# Patient Record
Sex: Female | Born: 1980 | Race: White | Hispanic: No | Marital: Married | Smoking: Never smoker
Health system: Southern US, Community
[De-identification: ages and names within clinical notes are randomized; demographics above are authoritative.]

## PROBLEM LIST (undated history)

## (undated) DIAGNOSIS — Z789 Other specified health status: Secondary | ICD-10-CM

## (undated) HISTORY — PX: CHOLECYSTECTOMY: SHX55

## (undated) HISTORY — PX: OTHER SURGICAL HISTORY: SHX169

## (undated) HISTORY — DX: Other specified health status: Z78.9

---

## 2007-09-13 ENCOUNTER — Emergency Department (HOSPITAL_COMMUNITY): Admission: EM | Admit: 2007-09-13 | Discharge: 2007-09-13 | Payer: Self-pay | Admitting: Family Medicine

## 2007-10-30 ENCOUNTER — Inpatient Hospital Stay (HOSPITAL_COMMUNITY): Admission: RE | Admit: 2007-10-30 | Discharge: 2007-11-01 | Payer: Self-pay | Admitting: Obstetrics & Gynecology

## 2007-11-08 ENCOUNTER — Ambulatory Visit: Admission: RE | Admit: 2007-11-08 | Discharge: 2007-11-08 | Payer: Self-pay | Admitting: Obstetrics and Gynecology

## 2009-11-06 ENCOUNTER — Inpatient Hospital Stay (HOSPITAL_COMMUNITY): Admission: AD | Admit: 2009-11-06 | Discharge: 2009-11-08 | Payer: Self-pay | Admitting: Obstetrics and Gynecology

## 2009-11-25 ENCOUNTER — Encounter: Admission: RE | Admit: 2009-11-25 | Discharge: 2009-11-25 | Payer: Self-pay | Admitting: Internal Medicine

## 2009-12-14 ENCOUNTER — Observation Stay (HOSPITAL_COMMUNITY): Admission: EM | Admit: 2009-12-14 | Discharge: 2009-12-16 | Payer: Self-pay | Admitting: Emergency Medicine

## 2009-12-15 ENCOUNTER — Encounter (INDEPENDENT_AMBULATORY_CARE_PROVIDER_SITE_OTHER): Payer: Self-pay

## 2009-12-15 ENCOUNTER — Encounter: Payer: Self-pay | Admitting: General Surgery

## 2010-10-05 LAB — CBC
HCT: 35.7 % — ABNORMAL LOW (ref 36.0–46.0)
HCT: 38 % (ref 36.0–46.0)
Hemoglobin: 13 g/dL (ref 12.0–15.0)
MCHC: 34.3 g/dL (ref 30.0–36.0)
MCV: 90.8 fL (ref 78.0–100.0)
MCV: 91.4 fL (ref 78.0–100.0)
Platelets: 160 10*3/uL (ref 150–400)
Platelets: 175 10*3/uL (ref 150–400)
RBC: 3.93 MIL/uL (ref 3.87–5.11)
RBC: 4.15 MIL/uL (ref 3.87–5.11)
RDW: 12.3 % (ref 11.5–15.5)

## 2010-10-05 LAB — COMPREHENSIVE METABOLIC PANEL
AST: 248 U/L — ABNORMAL HIGH (ref 0–37)
BUN: 6 mg/dL (ref 6–23)
BUN: 9 mg/dL (ref 6–23)
Calcium: 9.4 mg/dL (ref 8.4–10.5)
Chloride: 105 mEq/L (ref 96–112)
Chloride: 108 mEq/L (ref 96–112)
Creatinine, Ser: 0.66 mg/dL (ref 0.4–1.2)
GFR calc Af Amer: >60 mL/min (ref >60)
GFR calc non Af Amer: >60 mL/min (ref >60)
Potassium: 3.6 mEq/L (ref 3.5–5.1)
Sodium: 139 mEq/L (ref 135–145)
Sodium: 140 mEq/L (ref 135–145)
Total Bilirubin: 4.6 mg/dL — ABNORMAL HIGH (ref 0.3–1.2)
Total Protein: 6.6 g/dL (ref 6.0–8.3)
Total Protein: 7.2 g/dL (ref 6.0–8.3)

## 2010-10-05 LAB — URINE MICROSCOPIC-ADD ON

## 2010-10-05 LAB — PREGNANCY, URINE: Preg Test, Ur: NEGATIVE

## 2010-10-05 LAB — LIPASE, BLOOD: Lipase: 42 U/L (ref 11–59)

## 2010-10-05 LAB — PROTIME-INR
INR: 1 (ref 0.00–1.49)
Prothrombin Time: 13.1 seconds (ref 11.6–15.2)

## 2010-10-05 LAB — URINALYSIS, ROUTINE W REFLEX MICROSCOPIC
Glucose, UA: NEGATIVE mg/dL
Ketones, ur: NEGATIVE mg/dL
Specific Gravity, Urine: 1.016 (ref 1.005–1.030)
Urobilinogen, UA: 0.2 mg/dL (ref 0.0–1.0)

## 2010-10-05 LAB — DIFFERENTIAL
Basophils Absolute: 0 10*3/uL (ref 0.0–0.1)
Basophils Absolute: 0 10*3/uL (ref 0.0–0.1)
Basophils Relative: 1 % (ref 0–1)
Eosinophils Absolute: 0.1 10*3/uL (ref 0.0–0.7)
Eosinophils Relative: 1 % (ref 0–5)
Lymphs Abs: 1.5 10*3/uL (ref 0.7–4.0)
Monocytes Absolute: 0.4 10*3/uL (ref 0.1–1.0)
Neutro Abs: 4.2 10*3/uL (ref 1.7–7.7)

## 2010-10-05 LAB — URINE CULTURE

## 2010-10-05 LAB — APTT: aPTT: 26 seconds (ref 24–37)

## 2010-10-06 LAB — RPR: RPR Ser Ql: NONREACTIVE

## 2010-10-06 LAB — CBC
HCT: 35.3 % — ABNORMAL LOW (ref 36.0–46.0)
HCT: 39.5 % (ref 36.0–46.0)
Hemoglobin: 12.1 g/dL (ref 12.0–15.0)
Hemoglobin: 13.5 g/dL (ref 12.0–15.0)
Platelets: 209 10*3/uL (ref 150–400)
RBC: 3.74 MIL/uL — ABNORMAL LOW (ref 3.87–5.11)
RBC: 4.25 MIL/uL (ref 3.87–5.11)
RDW: 14.6 % (ref 11.5–15.5)
WBC: 13.4 10*3/uL — ABNORMAL HIGH (ref 4.0–10.5)
WBC: 9 10*3/uL (ref 4.0–10.5)

## 2011-04-09 LAB — DIFFERENTIAL
Basophils Absolute: 0
Basophils Relative: 0
Lymphocytes Relative: 6 — ABNORMAL LOW
Neutro Abs: 11 — ABNORMAL HIGH
Neutrophils Relative %: 88 — ABNORMAL HIGH

## 2011-04-09 LAB — CBC
Hemoglobin: 12.8
MCHC: 34.3
RBC: 4.17
RDW: 13
WBC: 12.4 — ABNORMAL HIGH

## 2011-04-13 LAB — COMPREHENSIVE METABOLIC PANEL
ALT: 18
Alkaline Phosphatase: 104
CO2: 22
Chloride: 103
Glucose, Bld: 85
Potassium: 4.4
Sodium: 136
Total Bilirubin: 0.4
Total Protein: 6.9

## 2011-04-13 LAB — CBC
HCT: 37.8
Hemoglobin: 13.4
MCHC: 35.2
MCV: 89.8
RBC: 3.47 — ABNORMAL LOW
RBC: 4.24
RDW: 14.6
WBC: 11.9 — ABNORMAL HIGH

## 2011-04-13 LAB — LACTATE DEHYDROGENASE: LDH: 118

## 2011-04-13 LAB — CCBB MATERNAL DONOR DRAW

## 2011-04-19 IMAGING — US US ABDOMEN COMPLETE
1 series · 14 of 25 positions shown · non-contrast
Comparison: None.

CLINICAL DATA: Episodic right upper quadrant pain, 2 weeks
postpartum, nausea

COMPLETE ABDOMINAL ULTRASOUND

[Series 1: us abdomen complete · 0.24mm/px · 14 of 64 slices shown]
[im 1/64]
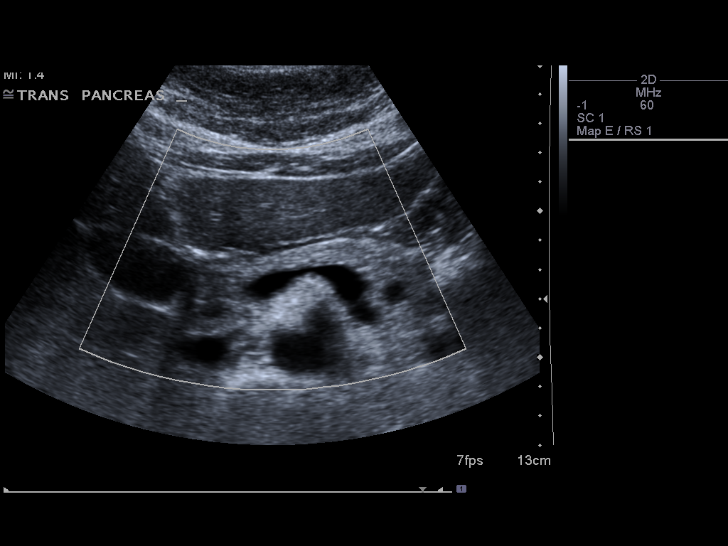
[im 6/64]
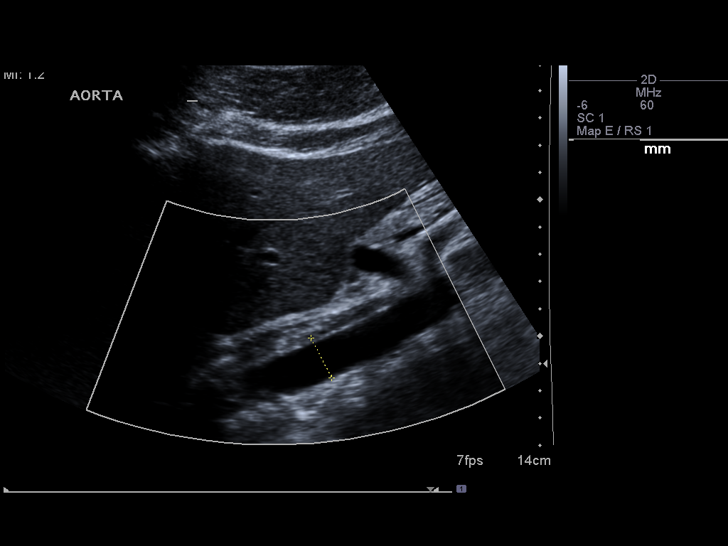
[im 11/64]
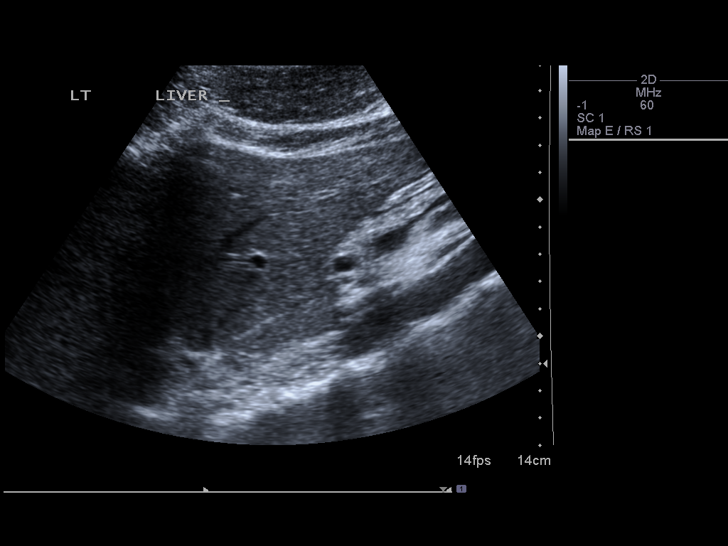
[im 16/64]
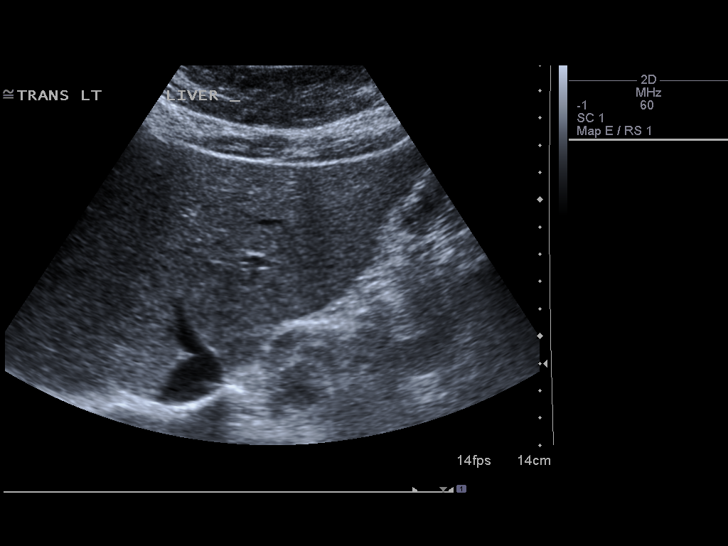
[im 22/64]
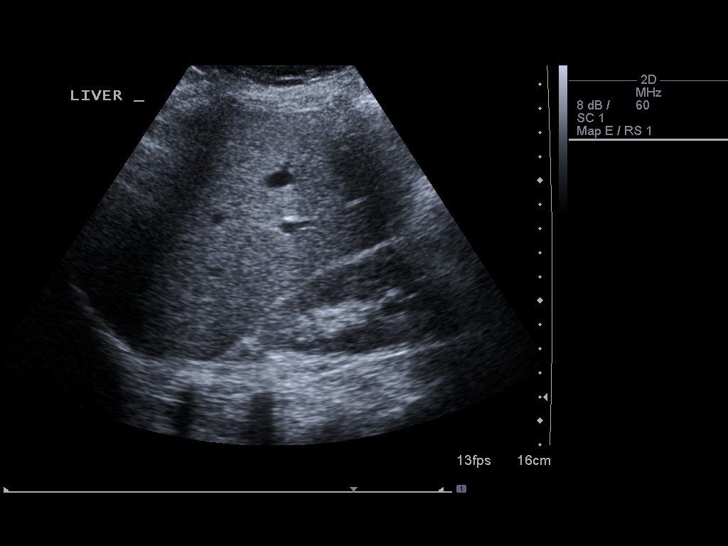
[im 24/64]
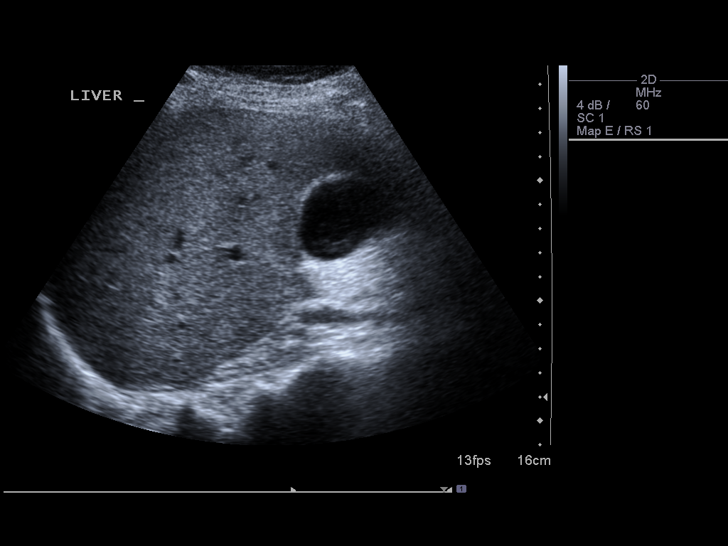
[im 29/64]
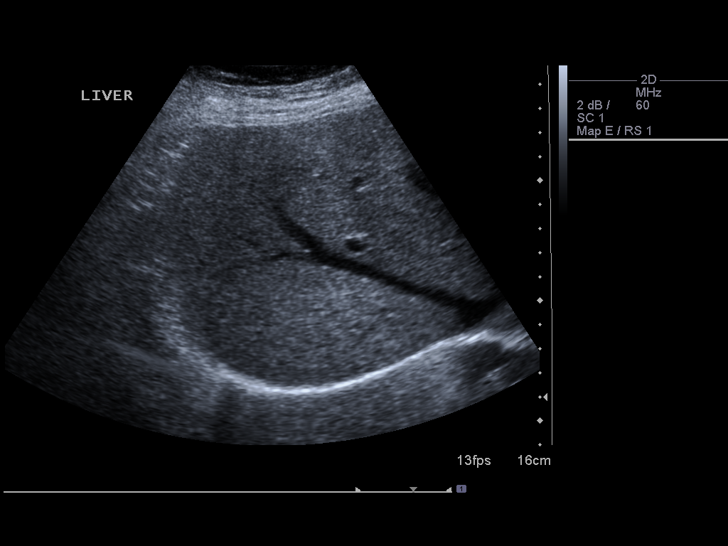
[im 35/64]
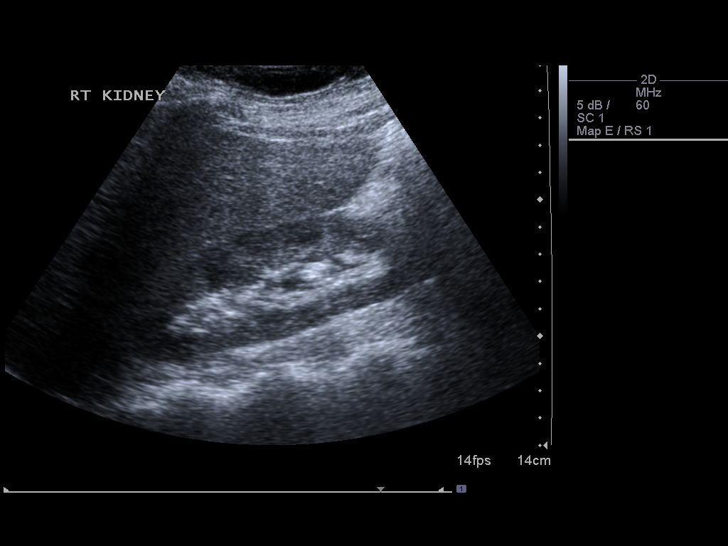
[im 40/64]
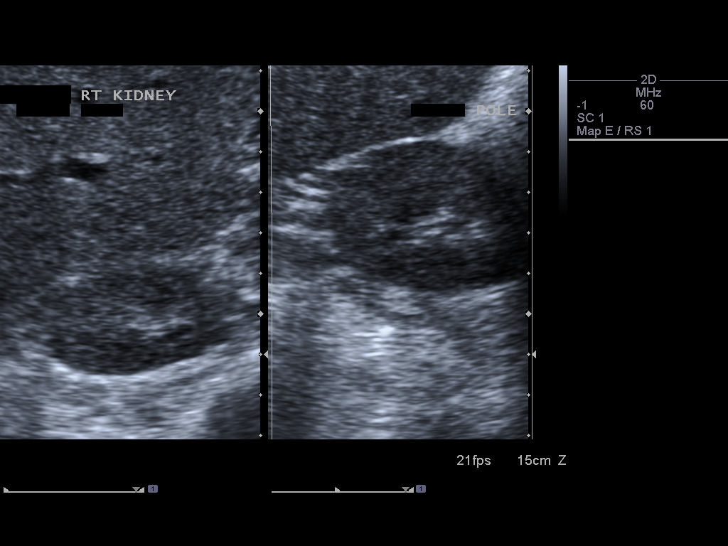
[im 43/64]
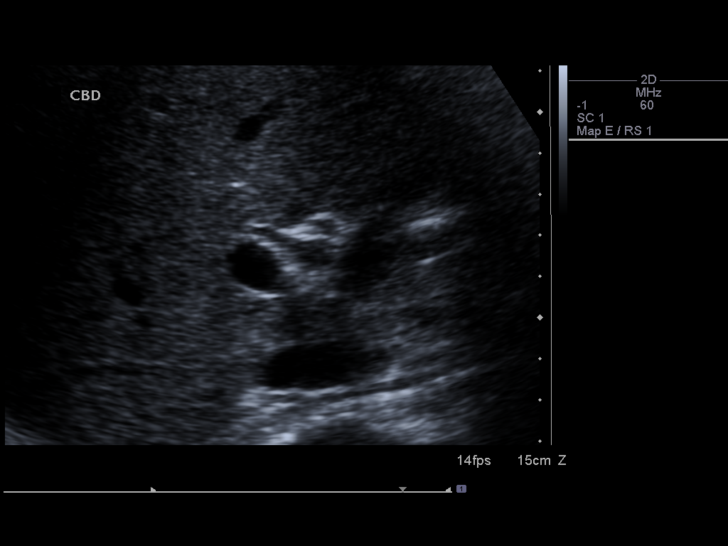
[im 48/64]
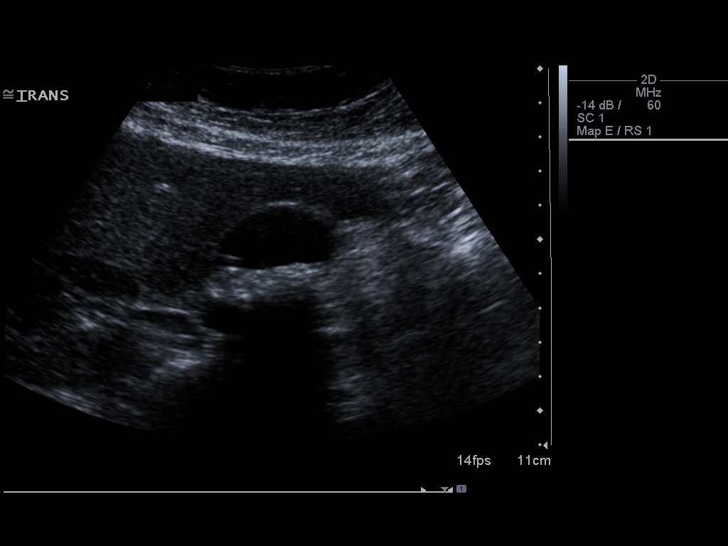
[im 53/64]
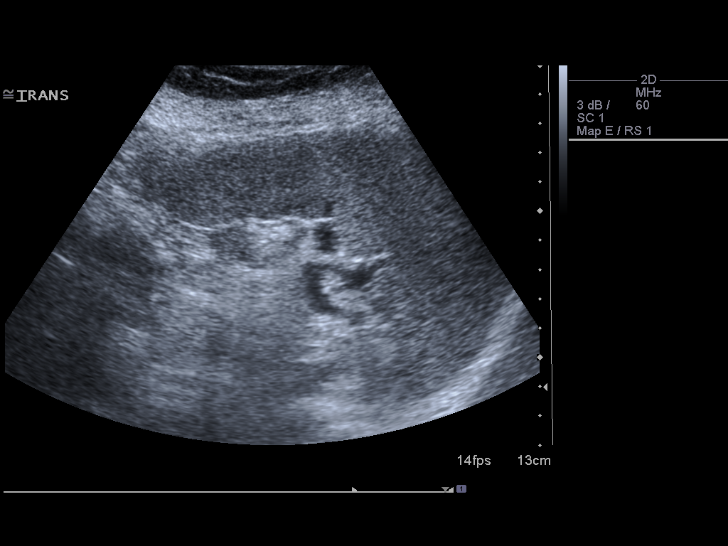
[im 58/64]
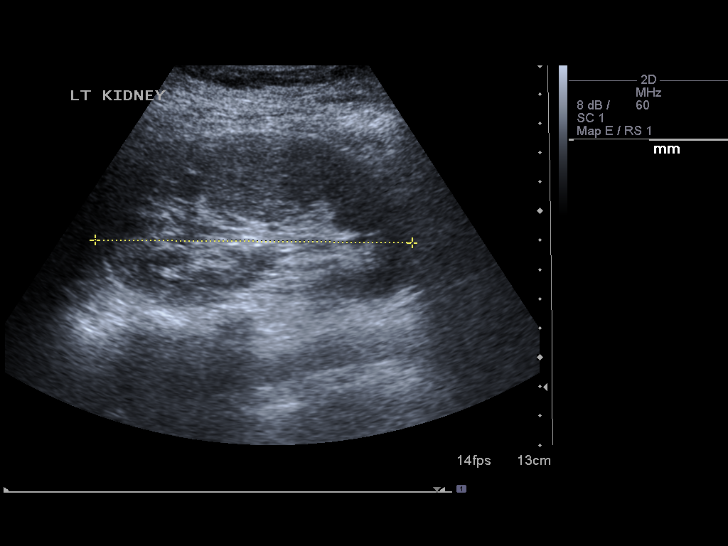
[im 64/64]
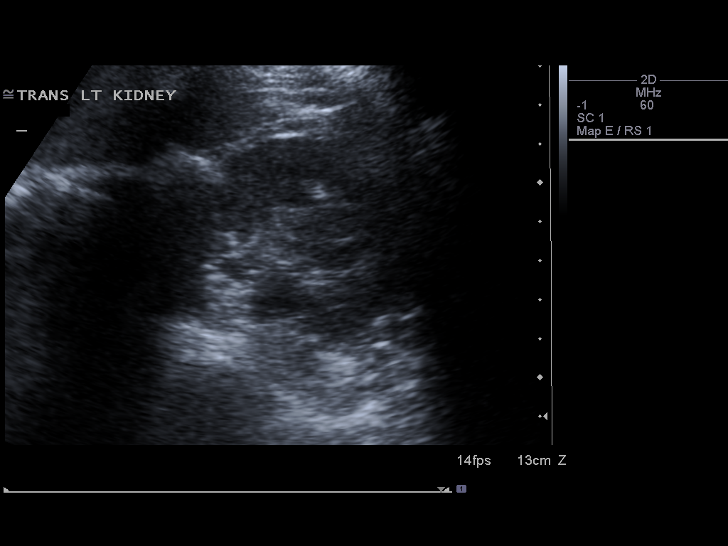

[14 of 25 positions shown; findings below may reference images not displayed]

FINDINGS: Gallbladder:  Multiple mobile echogenic small gallstones noted
within the gallbladder.  Despite this, there is no Murphy's sign,
wall thickening, or pericholecystic fluid.  Wall thickness is
normal measuring 2.3 mm.

Common bile duct:  6.4 mm diameter.  No dilatation or obstruction.

Liver:  No focal lesion identified.  Within normal limits in
parenchymal echogenicity.

IVC:  Appears normal.

Pancreas:  No focal abnormality seen.

Spleen:  9.7 cm length.  Normal echogenicity.  No focal
abnormality.  Accessory splenule noted measuring 2 cm.

Right Kidney:  12.2 cm length.  Normal echogenicity and cortex.  No
hydronephrosis or focal abnormality

Left Kidney:  10.8 cm length.  Normal echogenicity and cortex.  No
hydronephrosis or focal abnormality

Abdominal aorta:  No aneurysm identified.
IMPRESSION: Cholelithiasis otherwise negative abdominal ultrasound.

## 2011-05-08 IMAGING — CR DG CHEST 2V
2 series · 2 of 2 positions shown · non-contrast
Comparison: None

CLINICAL DATA: Abdominal pain.

CHEST - 2 VIEW

[w chest pa]
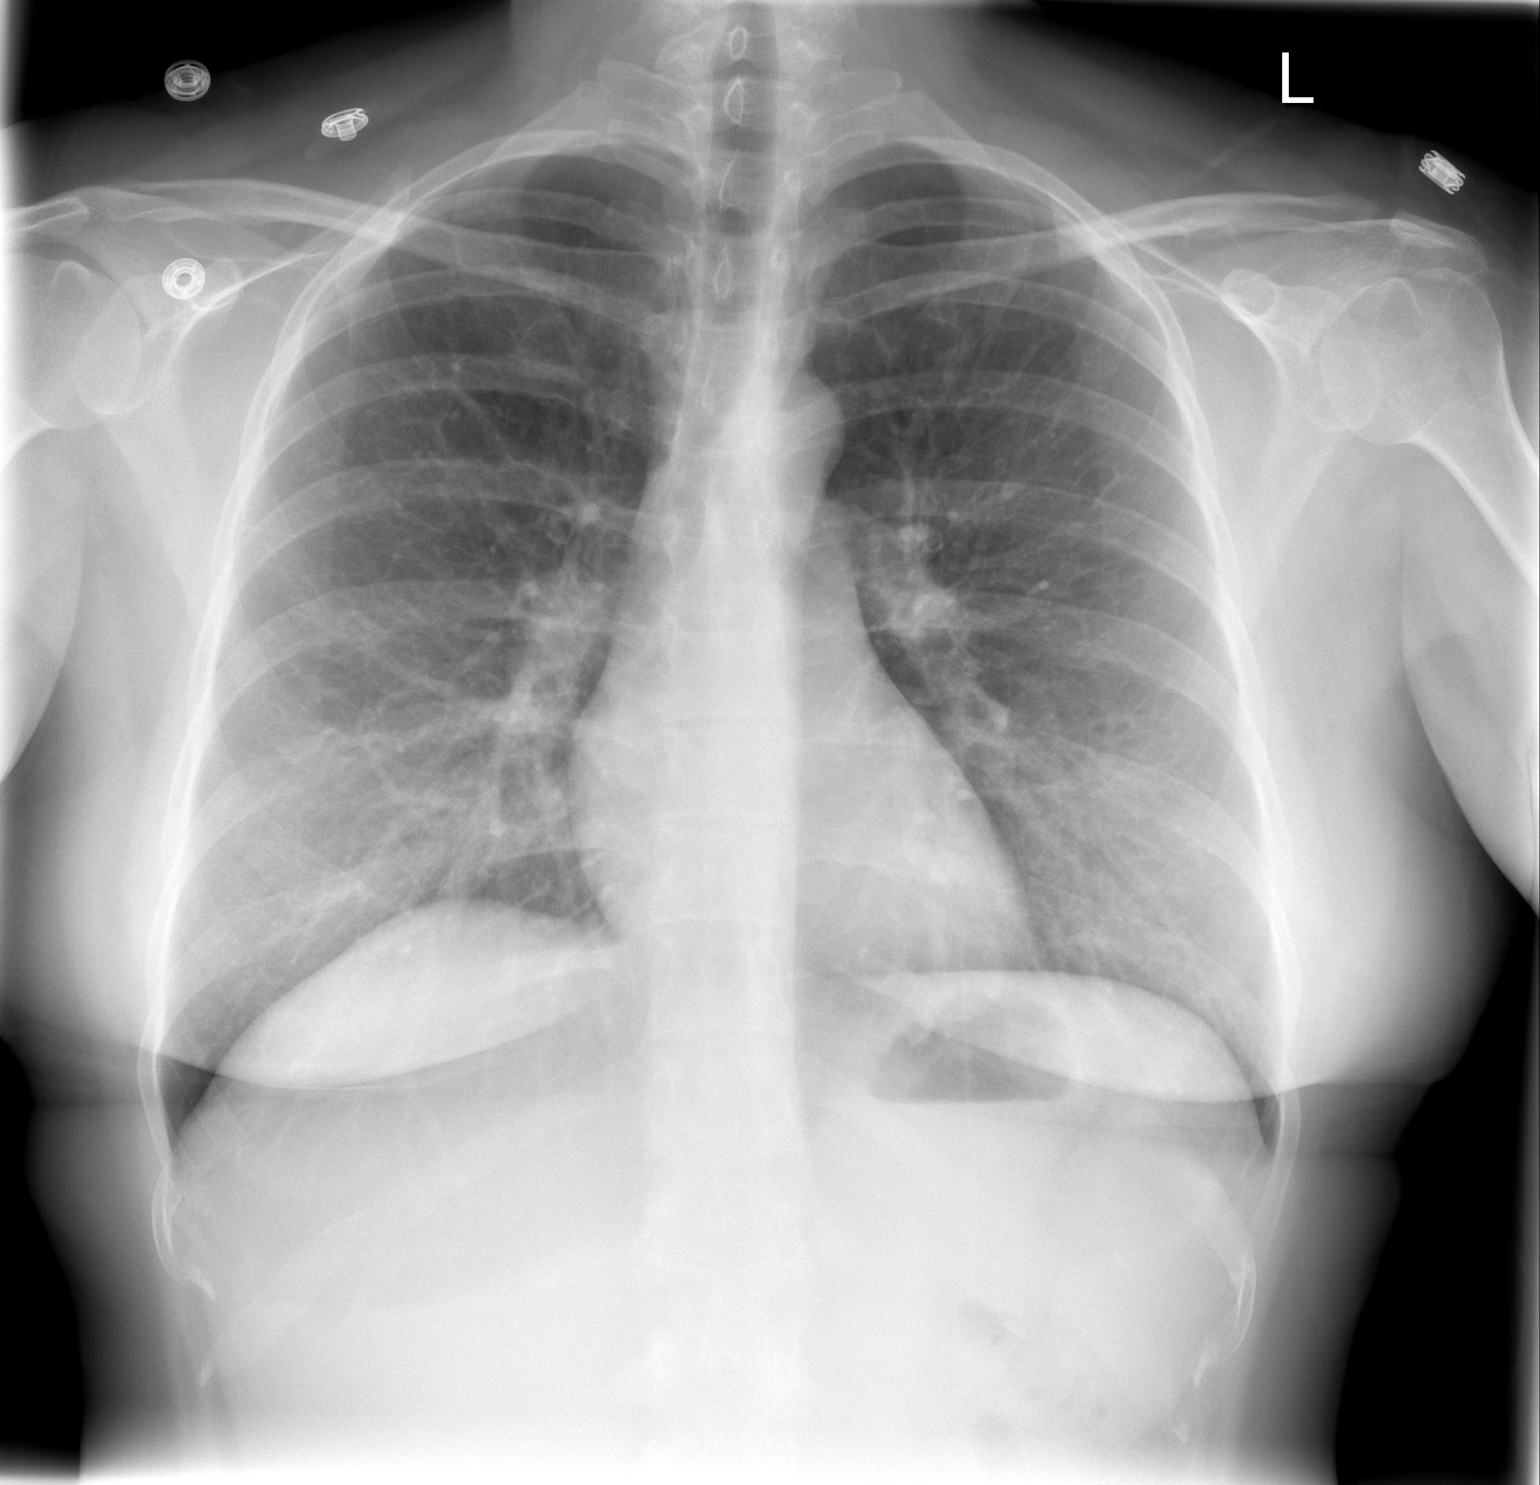

[w chest lat]
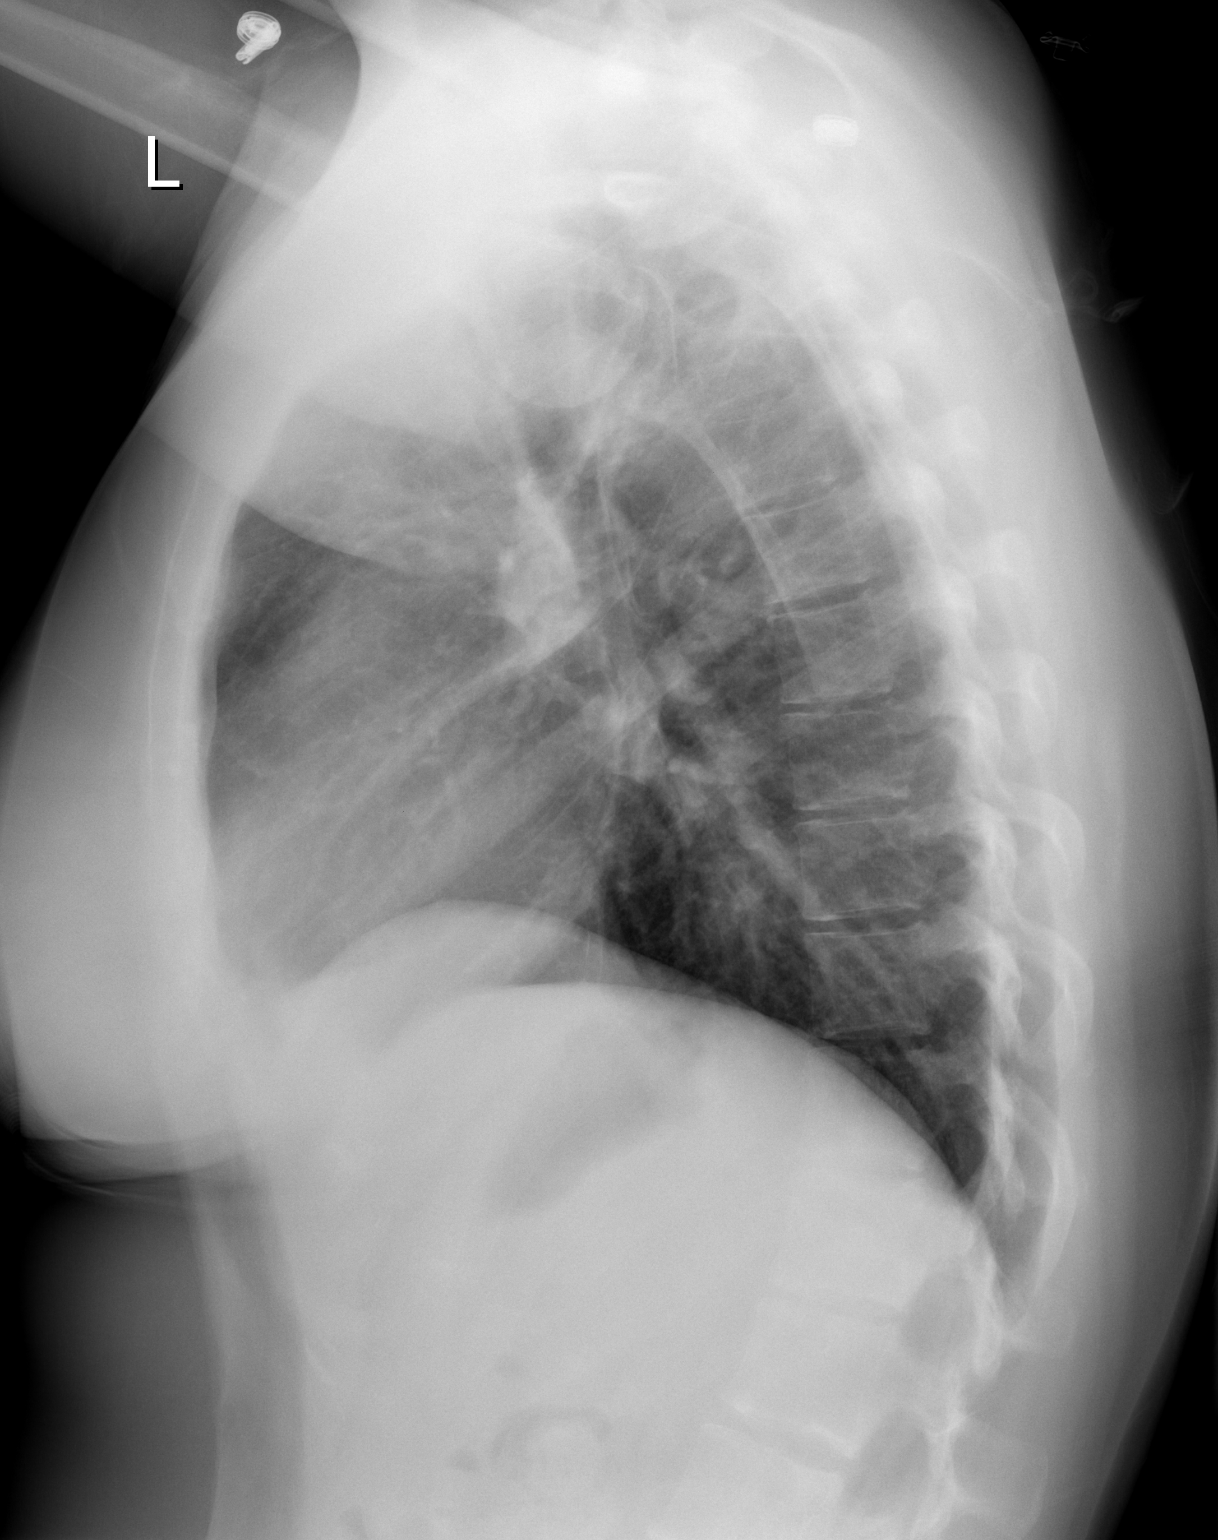

[2 of 2 positions shown; findings below may reference images not displayed]

FINDINGS: Heart and mediastinal contours are within normal limits.
No focal opacities or effusions.  No acute bony abnormality.
IMPRESSION: Normal study.

## 2011-05-08 IMAGING — US US ABDOMEN COMPLETE
1 series · 14 of 25 positions shown · non-contrast
Comparison: 11/25/2009

CLINICAL DATA: Right upper quadrant pain.

COMPLETE ABDOMINAL ULTRASOUND

[Series 1: us abdomen complete · 0.20mm/px · 14 of 79 slices shown]
[im 1/79]
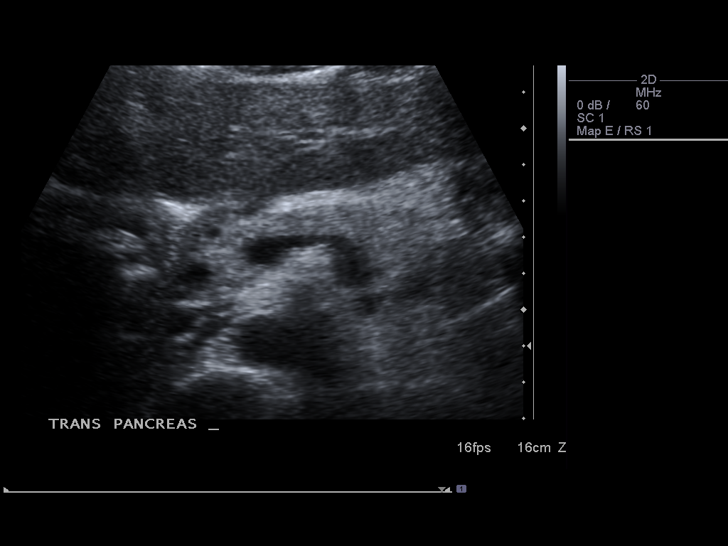
[im 7/79]
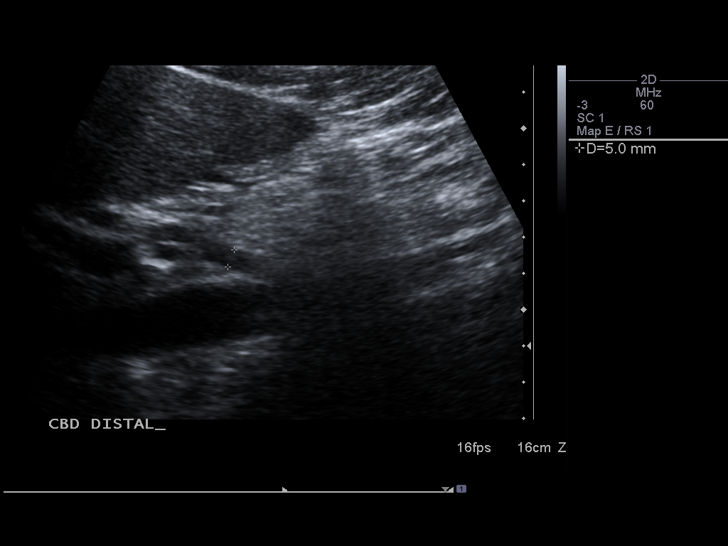
[im 14/79]
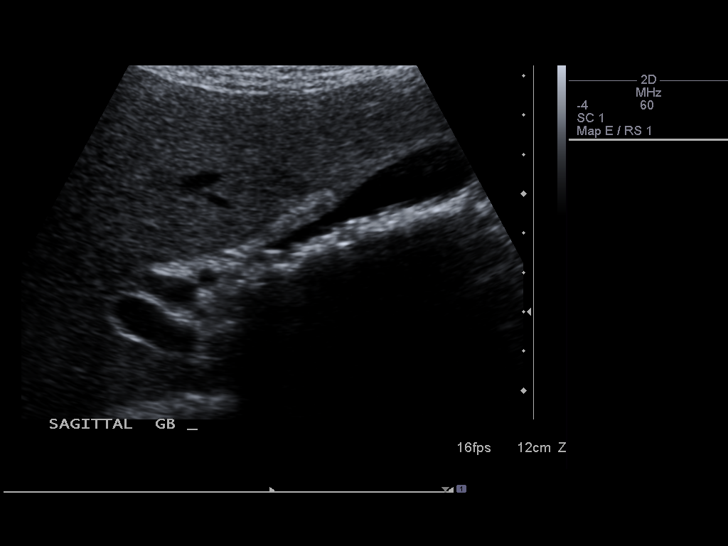
[im 20/79]
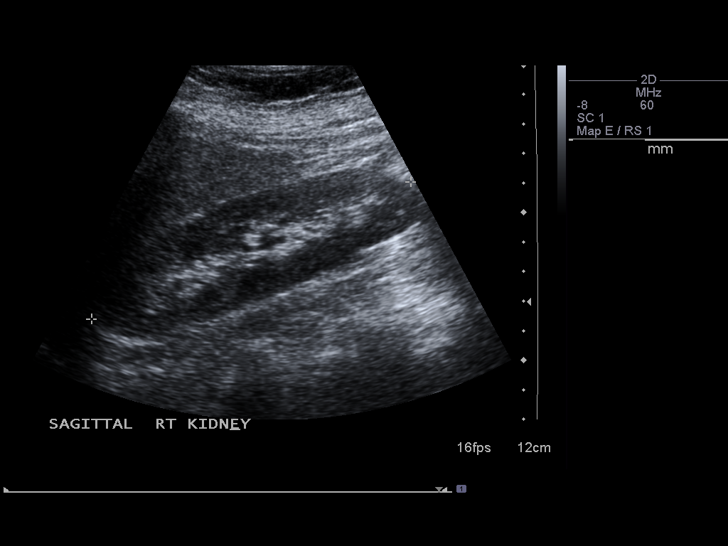
[im 27/79]
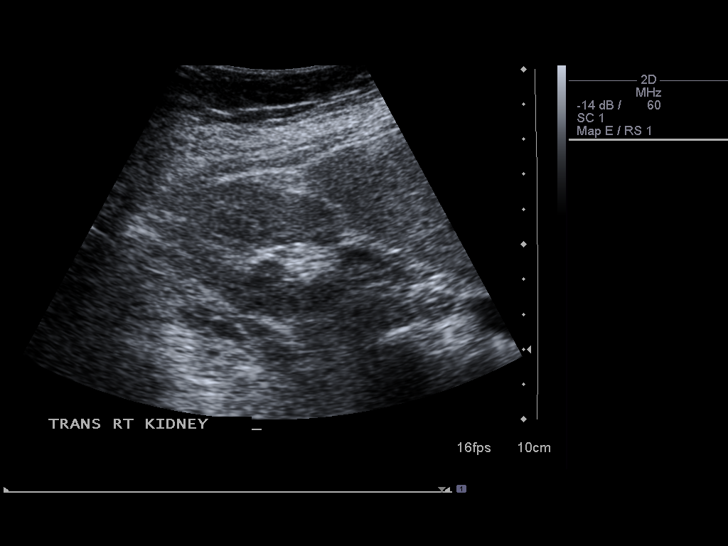
[im 30/79]
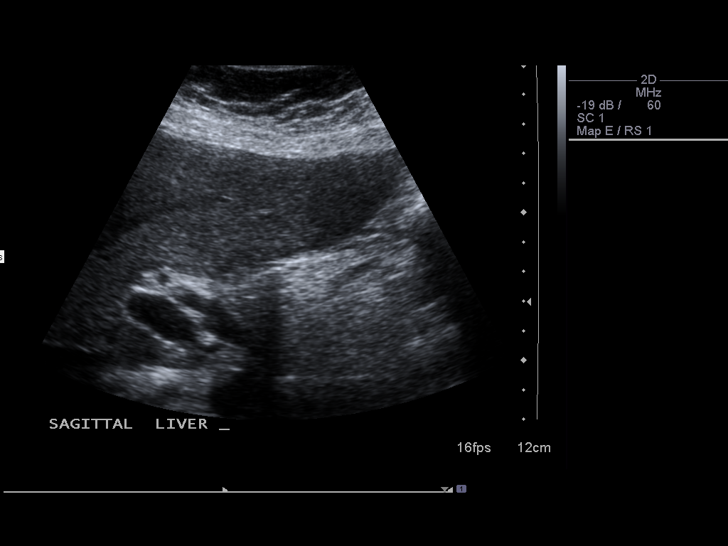
[im 36/79]
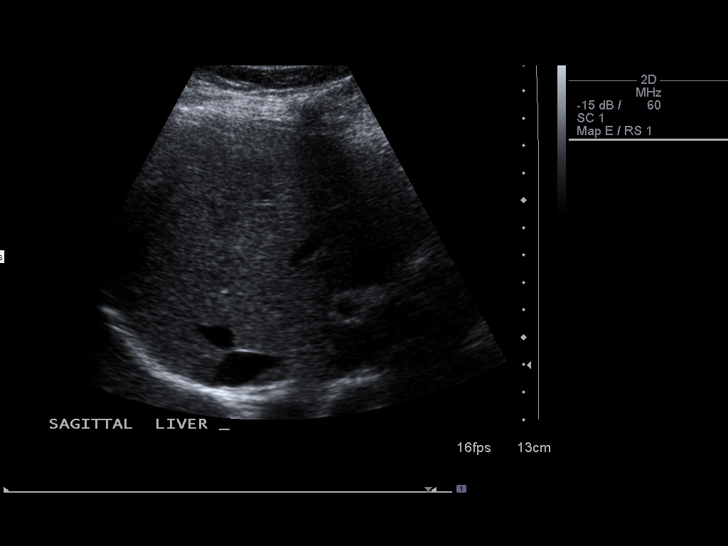
[im 43/79]
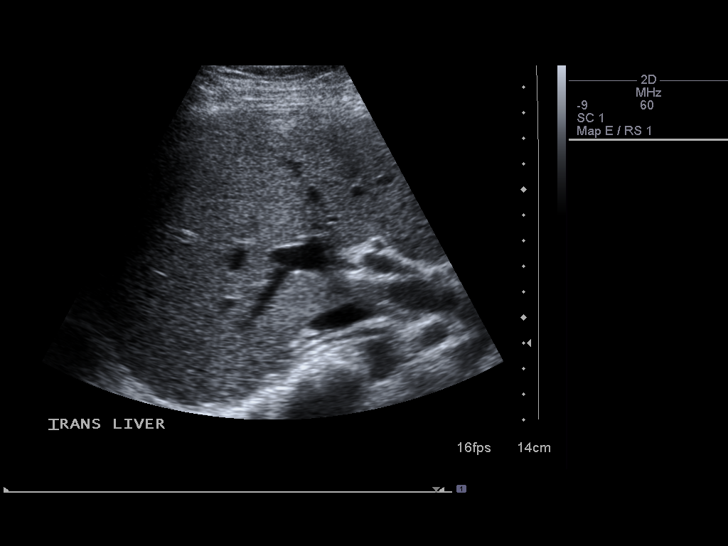
[im 49/79]
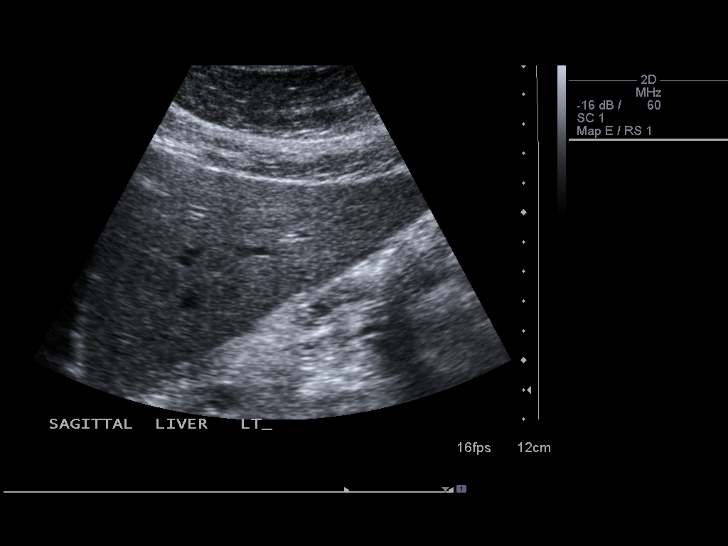
[im 53/79]
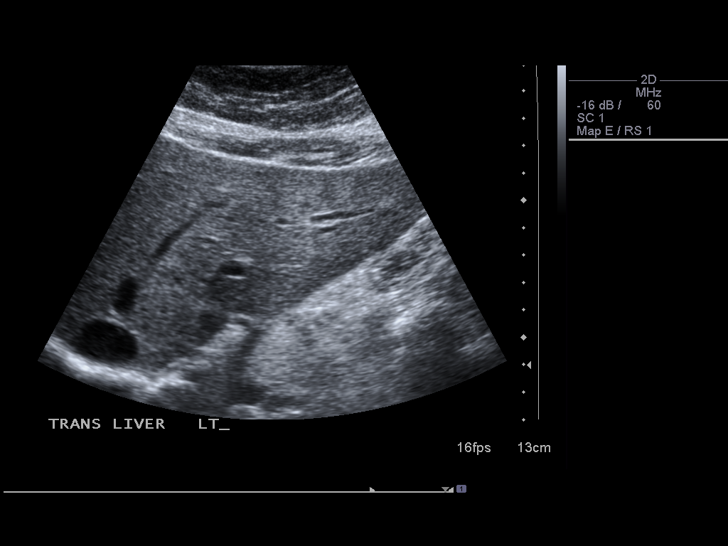
[im 59/79]
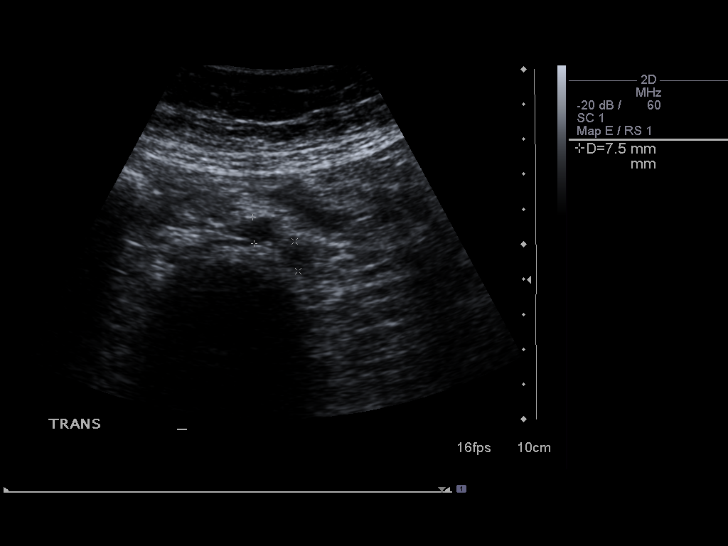
[im 66/79]
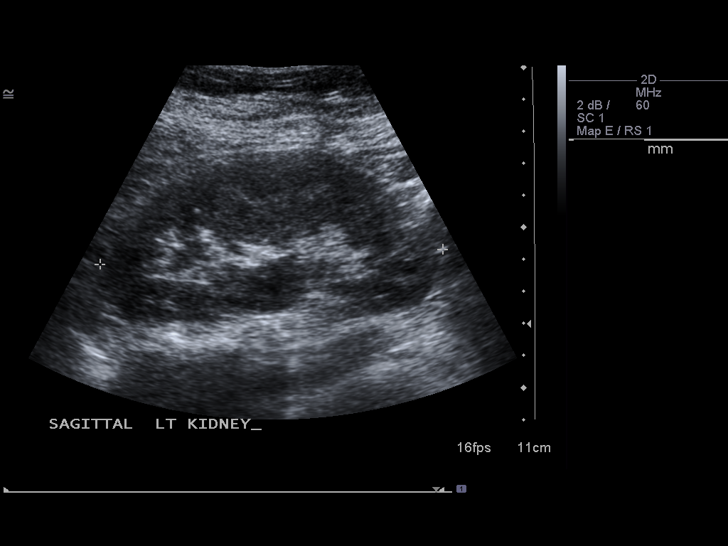
[im 72/79]
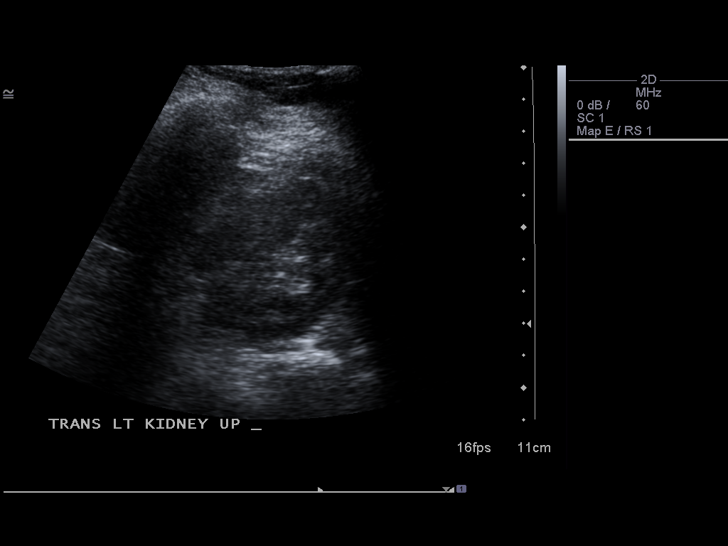
[im 79/79]
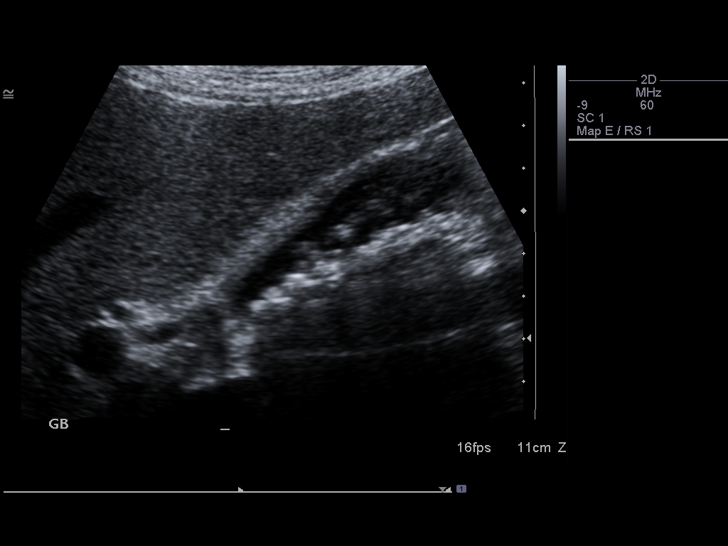

[14 of 25 positions shown; findings below may reference images not displayed]

FINDINGS: Gallbladder:  Multiple shadowing gallstones.  Gallbladder wall is
mildly thickened at 3.3 mm.  Negative sonographic Anschi.

Common bile duct:   Within normal limits in caliber.

Liver:  No focal lesion identified.  Within normal limits in
parenchymal echogenicity.

IVC:  Appears normal.

Pancreas:  No focal abnormality seen.

Spleen:  Within normal limits in size and echotexture.

Right Kidney:   Normal in size and parenchymal echogenicity.  No
evidence of mass or hydronephrosis.

Left Kidney:  Normal in size and parenchymal echogenicity.  No
evidence of mass or hydronephrosis.

Abdominal aorta:  No aneurysm identified.
IMPRESSION: Cholelithiasis.  Gallbladder wall slightly thickened.  Negative
sonographic Anschi.

## 2011-05-09 IMAGING — RF DG CHOLANGIOGRAM OPERATIVE
1 series · 6 of 6 positions shown · non-contrast
Comparison: Right upper quadrant ultrasound 12/14/2009.

CLINICAL DATA: 29-year-old female undergoing laparoscopic
cholecystectomy.

INTRAOPERATIVE CHOLANGIOGRAM
TECHNIQUE: Cholangiographic images from the C-arm fluoroscopic
device were submitted for interpretation post-operatively.  Please
see the procedural report for the amount of contrast and the
fluoroscopy time utilized.

[Series 1: run · 3 acquisitions, 6 frames shown]
[im 1/3]
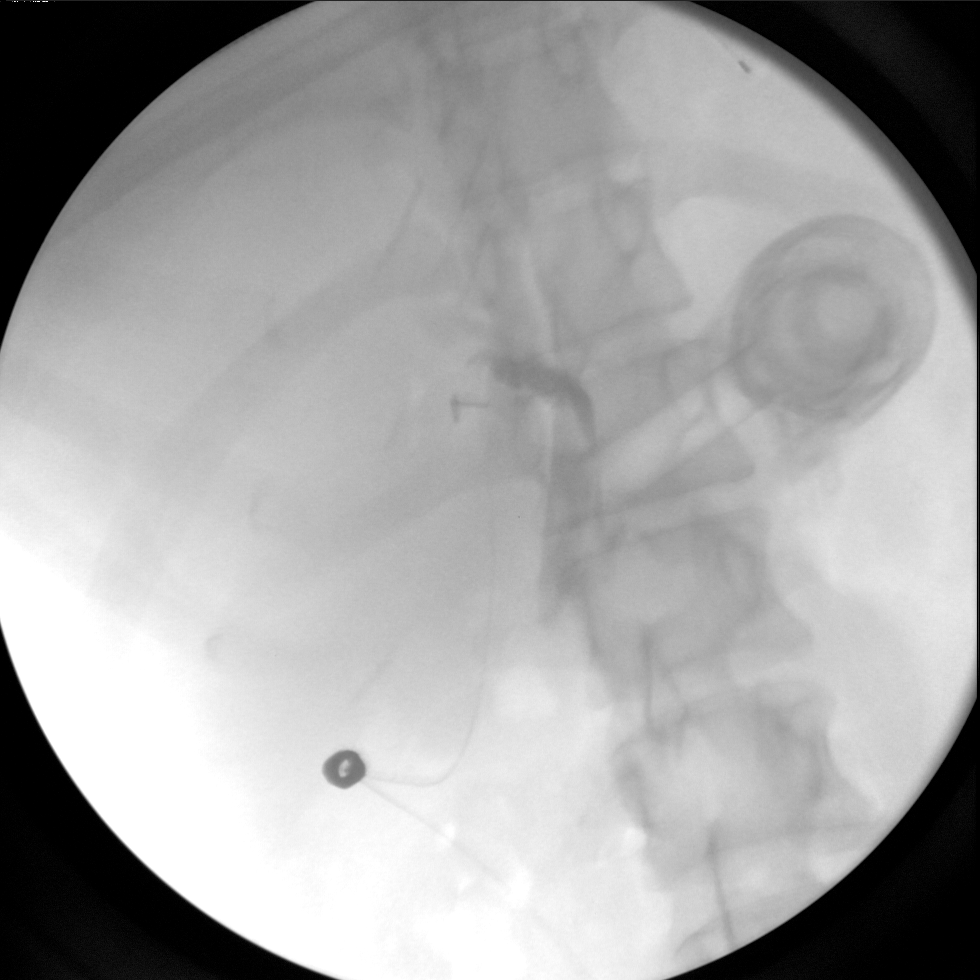
[im 1/3]
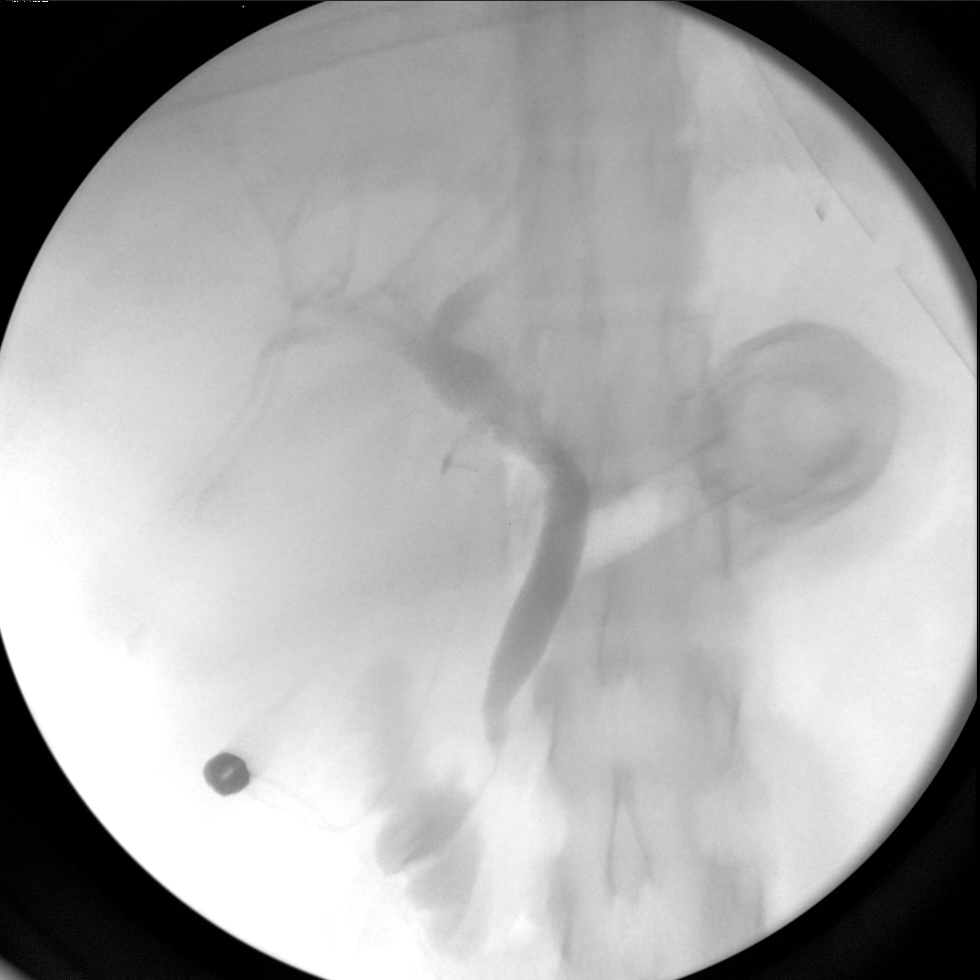
[im 1/3]
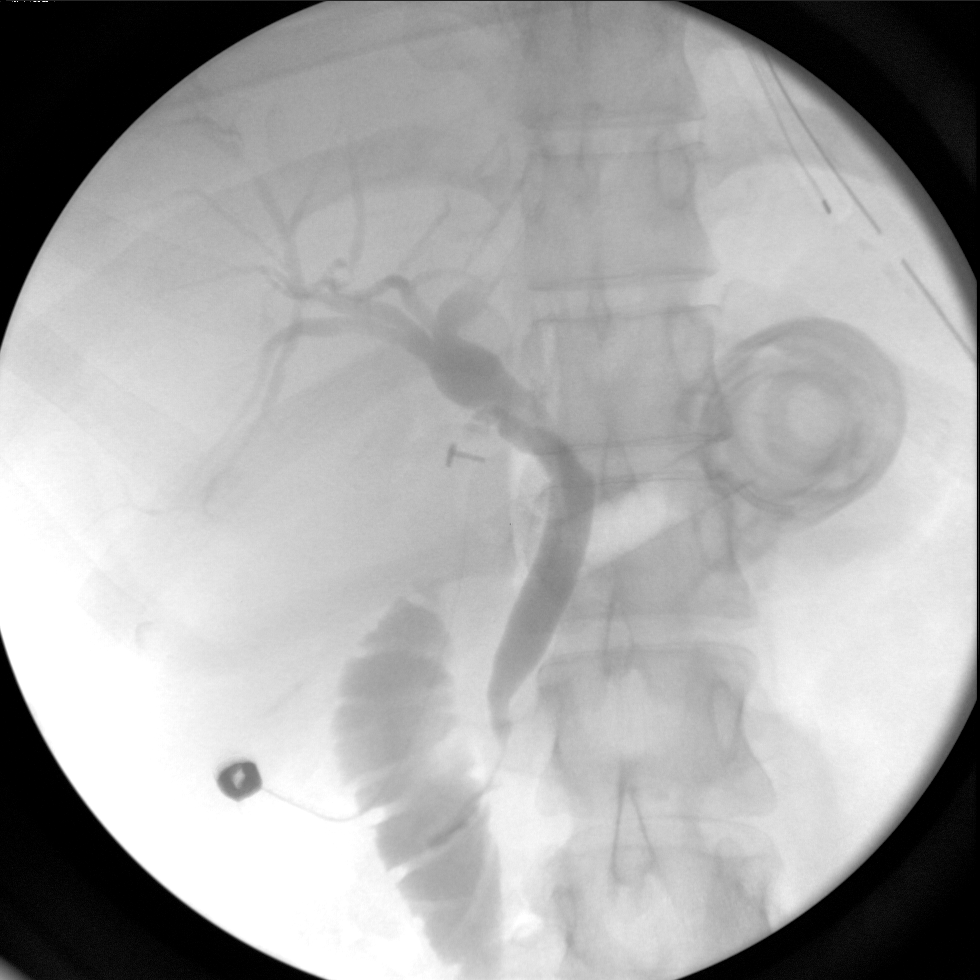
[im 1/3]
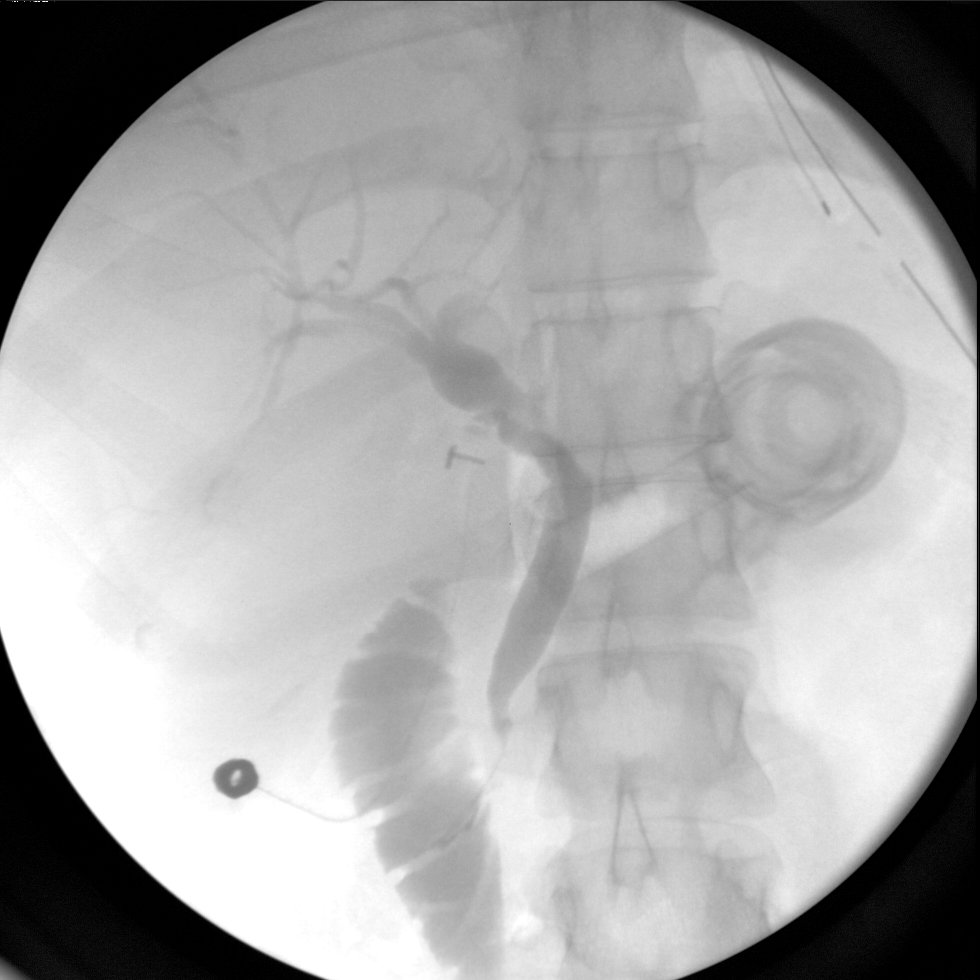
[im 2/3]
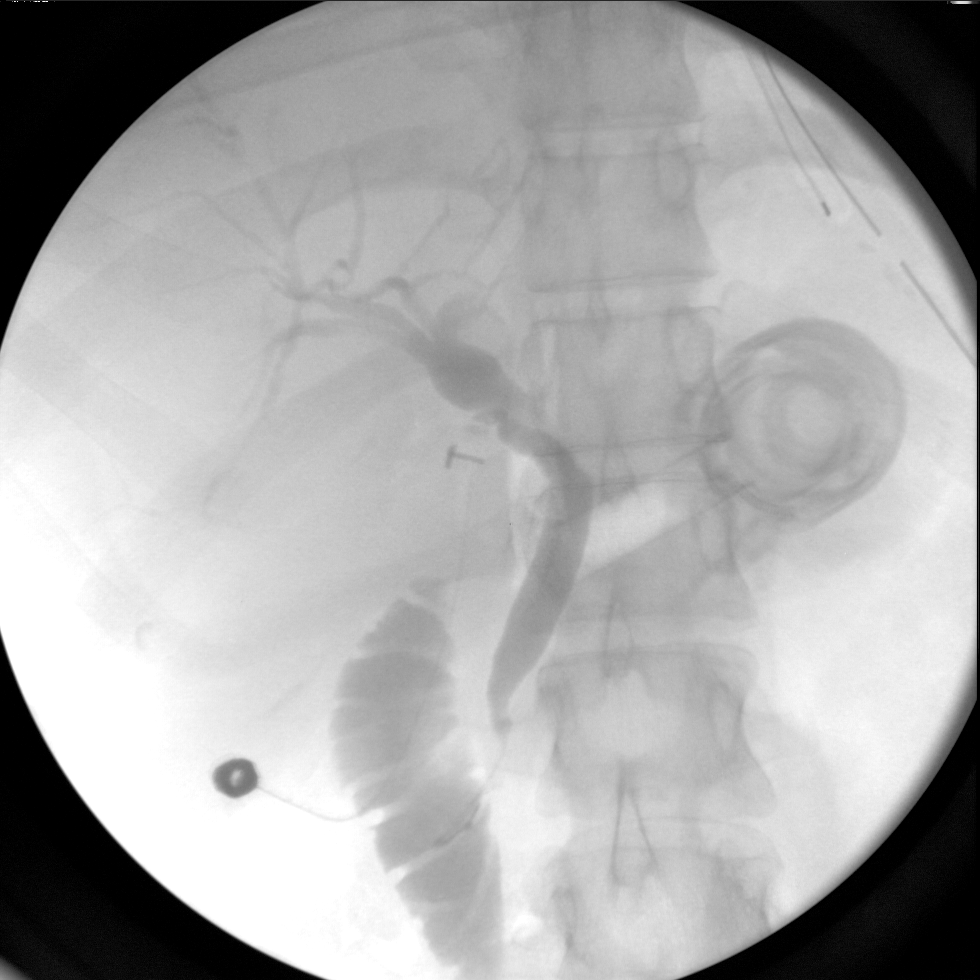
[im 3/3]
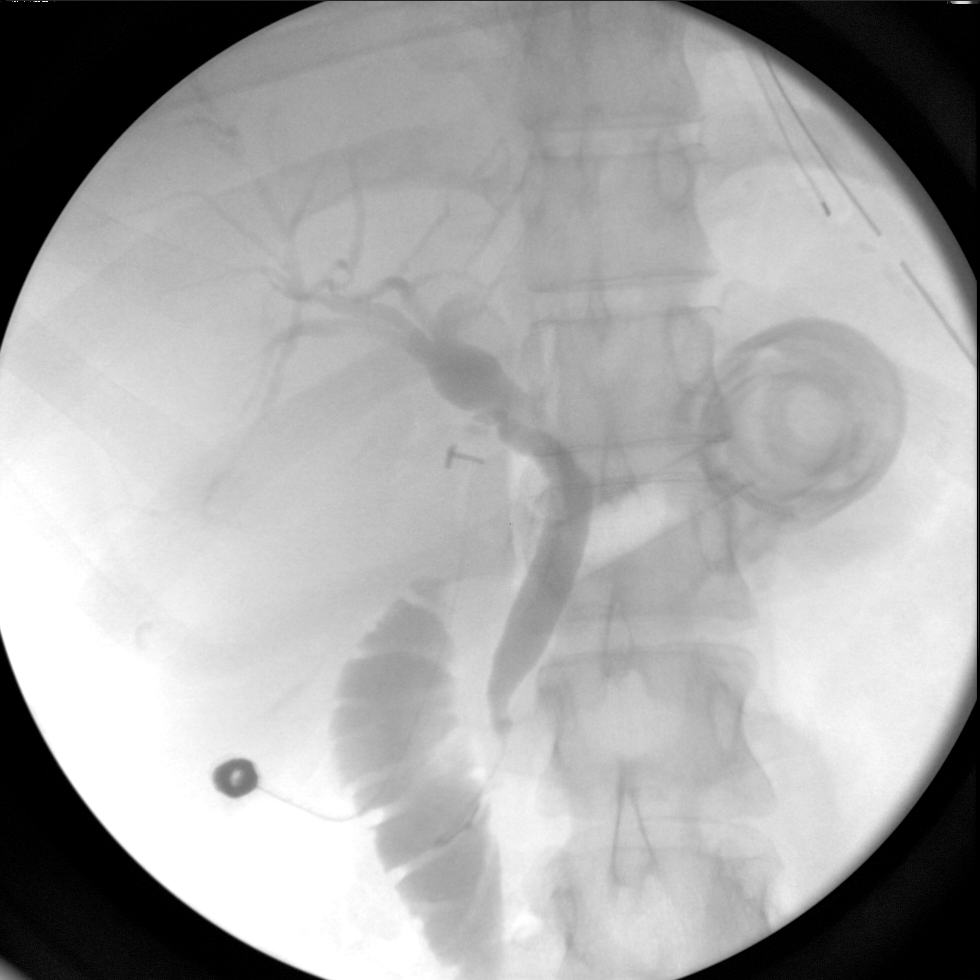

[6 of 6 positions shown; findings below may reference images not displayed]

FINDINGS: Multiple intraoperative fluoroscopic spot views of the
right upper quadrant.  Surgical clips at the cystic duct which is
cannulated.  Contrast injection demonstrates mild dilatation of the
intra and extrahepatic biliary tree.  There is prompt transit of
contrast to the duodenum.  No definite filling defect.  No
extravasation.
IMPRESSION: Mild dilatation of the intra and extrahepatic biliary tree,
otherwise negative intraoperative cholangiogram.

## 2013-01-31 ENCOUNTER — Other Ambulatory Visit: Payer: Self-pay | Admitting: Obstetrics and Gynecology

## 2014-09-22 ENCOUNTER — Ambulatory Visit (INDEPENDENT_AMBULATORY_CARE_PROVIDER_SITE_OTHER): Payer: BC Managed Care – PPO | Admitting: Family Medicine

## 2014-09-22 VITALS — BP 142/88 | HR 106 | Temp 98.3°F | Resp 16 | Ht 65.25 in | Wt 178.8 lb

## 2014-09-22 DIAGNOSIS — J020 Streptococcal pharyngitis: Secondary | ICD-10-CM

## 2014-09-22 DIAGNOSIS — J029 Acute pharyngitis, unspecified: Secondary | ICD-10-CM | POA: Diagnosis not present

## 2014-09-22 LAB — POCT RAPID STREP A (OFFICE): Rapid Strep A Screen: POSITIVE — AB

## 2014-09-22 MED ORDER — AMOXICILLIN 875 MG PO TABS: 875.0000 mg | ORAL_TABLET | Freq: Two times a day (BID) | ORAL | Status: DC

## 2014-09-22 NOTE — Progress Notes (Addendum)
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 She has a daughter who last week had strep infection.  Patient now complains of 2 days of severe sore throat. She has a terrible taste in her mouth as well.  She has some discomfort below her ears.  Objective: No acute distress HEENT: Unremarkable except patient has very large tonsils which are reddened with scant exudate overlying.  Neck: Supple no adenopathy  Results for orders placed or performed in visit on 09/22/14  POCT rapid strep A  Result Value Ref Range   Rapid Strep A Screen Positive (A) Negative   Assessment: Strep throat, acute   Plan: This chart was scribed in my presence and reviewed by me personally.    ICD-9-CM ICD-10-CM   1. Sore throat 462 J02.9 POCT rapid strep A     amoxicillin (AMOXIL) 875 MG tablet  2. Streptococcal sore throat 034.0 J02.0 amoxicillin (AMOXIL) 875 MG tablet     Signed, Elvina SidleKurt Anneliese Leblond, MD

## 2014-09-22 NOTE — Patient Instructions (Signed)

## 2015-03-21 ENCOUNTER — Other Ambulatory Visit: Payer: Self-pay | Admitting: Obstetrics and Gynecology

## 2015-03-25 LAB — CYTOLOGY - PAP

## 2015-04-06 ENCOUNTER — Ambulatory Visit (INDEPENDENT_AMBULATORY_CARE_PROVIDER_SITE_OTHER): Payer: BC Managed Care – PPO | Admitting: Physician Assistant

## 2015-04-06 VITALS — BP 124/72 | HR 90 | Temp 99.1°F | Resp 16 | Ht 65.25 in | Wt 179.0 lb

## 2015-04-06 DIAGNOSIS — F439 Reaction to severe stress, unspecified: Secondary | ICD-10-CM

## 2015-04-06 DIAGNOSIS — Z638 Other specified problems related to primary support group: Secondary | ICD-10-CM | POA: Diagnosis not present

## 2015-04-06 DIAGNOSIS — J029 Acute pharyngitis, unspecified: Secondary | ICD-10-CM

## 2015-04-06 LAB — POCT RAPID STREP A (OFFICE): RAPID STREP A SCREEN: NEGATIVE

## 2015-04-06 NOTE — Progress Notes (Signed)
04/06/2015 at 10:19 AM  April Horn / DOB: [birth year] / MRN: [id number]  The patient  does not have a problem list on file.  SUBJECTIVE  April Horn is a 34 y.o. female complaining of sore throat that started 9 days ago.  Associated symptoms include congestion and nasal dryness today, and she denies fever, cough, difficulty breathing, headache and jaw pain.The patient's symptoms show no change.  Treatments tried thus far include flonase and zyrtec with poor relief. She denies sick contacts.   She has a daughter s/p humerus fracture complicated by compartment syndrome now s/p five surgeries since mid July.  Reports the child is doing better now, but continues to recover.  Mother denies dysthymia, anhedonia, weight change and appetite changes.     She  has no past medical history on file.    Medications reviewed and updated by myself where necessary, and exist elsewhere in the encounter.   April Horn has No Known Allergies. She  reports that she has never smoked. She does not have any smokeless tobacco history on file. She  has no sexual activity history on file. 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  Her family history includes Hyperlipidemia in her brother; Hypertension in her brother.  Review of Systems  Constitutional: Negative for fever and chills.  Respiratory: Negative for shortness of breath.   Cardiovascular: Negative for chest pain.  Gastrointestinal: Negative for nausea and abdominal pain.  Genitourinary: Negative.   Skin: Negative for rash.  Neurological: Negative for dizziness and headaches.    OBJECTIVE  Her  height is 5' 5.25" (1.657 m) and weight is 179 lb (81.194 kg). Her oral temperature is 99.1 F (37.3 C). Her blood pressure is 124/72 and her pulse is 90. Her respiration is 16 and oxygen saturation is 98%.  The patient's body mass index is 29.57 kg/(m^2).  Physical Exam    Constitutional: She is oriented to person, place, and time. She appears well-developed and well-nourished. No distress.  HENT:  Right Ear: Hearing, tympanic membrane, external ear and ear canal normal.  Left Ear: Hearing, tympanic membrane, external ear and ear canal normal.  Nose: Mucosal edema present. Right sinus exhibits no maxillary sinus tenderness and no frontal sinus tenderness. Left sinus exhibits no maxillary sinus tenderness and no frontal sinus tenderness.  Mouth/Throat: Uvula is midline, oropharynx is clear and moist and mucous membranes are normal.  Cardiovascular: Normal rate, regular rhythm and normal heart sounds.   Respiratory: Effort normal and breath sounds normal. She has no wheezes. She has no rales.  Neurological: She is alert and oriented to person, place, and time.  Skin: Skin is warm and dry. She is not diaphoretic.  Psychiatric: She has a normal mood and affect.    Results for orders placed or performed in visit on 04/06/15 (from the past 24 hour(s))  POCT rapid strep A     Status: None   Collection Time: 04/06/15 10:07 AM  Result Value Ref Range   Rapid Strep A Screen Negative Negative    ASSESSMENT & PLAN  April Horn was seen today for sore throat, nasal congestion and was on amoxicillin x 5 days beginning of aug.  Diagnoses and all orders for this visit:  Sore throat: Most likely driven by allergies and post nasal drip.  Rapid negative and culture pending.  Will prescribe Abx if needed.  Advised patient continue flonase and to start applying topical emollient to  the nares bid.   -     POCT rapid strep A -     Culture, Group A Strep  Stress at home: Patient appears to be handling this well.  Advised that if she feels that she would benefit from talk therapy to call the clinic and I will refer.    The patient was advised to call or come back to clinic if she does not see an improvement in symptoms, or worsens with the above plan.   Deliah Boston, MHS,  PA-C [facility] 04/06/2015 10:19 AM

## 2015-04-08 LAB — CULTURE, GROUP A STREP: Organism ID, Bacteria: NORMAL

## 2016-07-19 NOTE — L&D Delivery Note (Signed)
Delivery Note Patient pushed well for 10 minutes.  At 1:36 PM a viable female was delivered via Vaginal, Spontaneous (Presentation: ROA).  APGAR: 9, 9; weight pending  .   Placenta status: spontaneous, in tact .  Cord: 3V with the following complications:None .  Cord pH: n/a  Anesthesia:  Epidural Episiotomy: None Lacerations: 1st degree;Perineal Suture Repair: 3.0 vicryl rapide Est. Blood Loss (mL): 200  Mom to postpartum.  Baby to Couplet care / Skin to Skin.  Tyra Michelle GEFFEL Rena Hunke 07/14/2017, 2:51 PM

## 2016-12-14 ENCOUNTER — Other Ambulatory Visit: Payer: Self-pay | Admitting: Obstetrics and Gynecology

## 2017-01-11 LAB — OB RESULTS CONSOLE HIV ANTIBODY (ROUTINE TESTING): HIV: NONREACTIVE

## 2017-01-11 LAB — OB RESULTS CONSOLE GC/CHLAMYDIA
Chlamydia: NEGATIVE
GC PROBE AMP, GENITAL: NEGATIVE

## 2017-01-11 LAB — OB RESULTS CONSOLE RUBELLA ANTIBODY, IGM: Rubella: IMMUNE

## 2017-01-11 LAB — OB RESULTS CONSOLE RPR: RPR: NONREACTIVE

## 2017-01-11 LAB — OB RESULTS CONSOLE ABO/RH: RH TYPE: POSITIVE

## 2017-01-11 LAB — OB RESULTS CONSOLE ANTIBODY SCREEN: ANTIBODY SCREEN: NEGATIVE

## 2017-01-11 LAB — OB RESULTS CONSOLE HEPATITIS B SURFACE ANTIGEN: Hepatitis B Surface Ag: NEGATIVE

## 2017-06-24 LAB — OB RESULTS CONSOLE GBS: STREP GROUP B AG: NEGATIVE

## 2017-07-04 ENCOUNTER — Telehealth (HOSPITAL_COMMUNITY): Payer: Self-pay | Admitting: *Deleted

## 2017-07-04 ENCOUNTER — Encounter (HOSPITAL_COMMUNITY): Payer: Self-pay | Admitting: *Deleted

## 2017-07-04 NOTE — Telephone Encounter (Signed)
Preadmission screen  

## 2017-07-05 MED ORDER — TERBUTALINE SULFATE 1 MG/ML IJ SOLN
0.2500 mg | INTRAMUSCULAR | Status: DC | PRN
  Filled 2017-07-05: qty 1

## 2017-07-06 ENCOUNTER — Ambulatory Visit (HOSPITAL_COMMUNITY)
Admission: RE | Admit: 2017-07-06 | Discharge: 2017-07-06 | Disposition: A | Discharge status: Home / Self Care | Payer: Self-pay | Source: Ambulatory Visit | Attending: Obstetrics | Admitting: Obstetrics

## 2017-07-07 ENCOUNTER — Ambulatory Visit (HOSPITAL_COMMUNITY): Admission: RE | Admit: 2017-07-07 | Payer: Self-pay | Source: Ambulatory Visit

## 2017-07-07 ENCOUNTER — Encounter (HOSPITAL_COMMUNITY): Payer: Self-pay

## 2017-07-14 ENCOUNTER — Inpatient Hospital Stay (HOSPITAL_COMMUNITY): Payer: Commercial Managed Care - PPO | Admitting: Anesthesiology

## 2017-07-14 ENCOUNTER — Inpatient Hospital Stay (HOSPITAL_COMMUNITY)
Admission: AD | Admit: 2017-07-14 | Discharge: 2017-07-16 | DRG: 807 | Disposition: A | Discharge status: Home / Self Care | Payer: Commercial Managed Care - PPO | Source: Ambulatory Visit | Attending: Obstetrics | Admitting: Obstetrics

## 2017-07-14 ENCOUNTER — Other Ambulatory Visit: Payer: Self-pay

## 2017-07-14 ENCOUNTER — Encounter (HOSPITAL_COMMUNITY): Payer: Self-pay

## 2017-07-14 DIAGNOSIS — O139 Gestational [pregnancy-induced] hypertension without significant proteinuria, unspecified trimester: Secondary | ICD-10-CM | POA: Diagnosis present

## 2017-07-14 DIAGNOSIS — O134 Gestational [pregnancy-induced] hypertension without significant proteinuria, complicating childbirth: Secondary | ICD-10-CM | POA: Diagnosis present

## 2017-07-14 DIAGNOSIS — O133 Gestational [pregnancy-induced] hypertension without significant proteinuria, third trimester: Secondary | ICD-10-CM

## 2017-07-14 DIAGNOSIS — Z3A38 38 weeks gestation of pregnancy: Secondary | ICD-10-CM

## 2017-07-14 LAB — CBC
HEMATOCRIT: 36.4 % (ref 36.0–46.0)
HEMATOCRIT: 34.7 % — AB (ref 36.0–46.0)
HEMOGLOBIN: 12.2 g/dL (ref 12.0–15.0)
Hemoglobin: 13 g/dL (ref 12.0–15.0)
MCH: 31.3 pg (ref 26.0–34.0)
MCH: 31 pg (ref 26.0–34.0)
MCHC: 35.7 g/dL (ref 30.0–36.0)
MCHC: 35.2 g/dL (ref 30.0–36.0)
MCV: 87.7 fL (ref 78.0–100.0)
MCV: 88.3 fL (ref 78.0–100.0)
PLATELETS: 224 10*3/uL (ref 150–400)
PLATELETS: 190 10*3/uL (ref 150–400)
RBC: 4.15 MIL/uL (ref 3.87–5.11)
RBC: 3.93 MIL/uL (ref 3.87–5.11)
RDW: 13.3 % (ref 11.5–15.5)
RDW: 13.3 % (ref 11.5–15.5)
WBC: 9.7 10*3/uL (ref 4.0–10.5)
WBC: 11.9 10*3/uL — ABNORMAL HIGH (ref 4.0–10.5)

## 2017-07-14 LAB — COMPREHENSIVE METABOLIC PANEL
ALT: 19 U/L (ref 14–54)
AST: 18 U/L (ref 15–41)
Albumin: 3.3 g/dL — ABNORMAL LOW (ref 3.5–5.0)
Alkaline Phosphatase: 103 U/L (ref 38–126)
Anion gap: 9 (ref 5–15)
BILIRUBIN TOTAL: 0.8 mg/dL (ref 0.3–1.2)
BUN: 10 mg/dL (ref 6–20)
CHLORIDE: 103 mmol/L (ref 101–111)
CO2: 22 mmol/L (ref 22–32)
CREATININE: 0.52 mg/dL (ref 0.44–1.00)
Calcium: 9.4 mg/dL (ref 8.9–10.3)
Glucose, Bld: 97 mg/dL (ref 65–99)
Potassium: 4.5 mmol/L (ref 3.5–5.1)
Sodium: 134 mmol/L — ABNORMAL LOW (ref 135–145)
TOTAL PROTEIN: 6.4 g/dL — AB (ref 6.5–8.1)

## 2017-07-14 LAB — RPR: RPR: NONREACTIVE

## 2017-07-14 LAB — PROTEIN / CREATININE RATIO, URINE: CREATININE, URINE: 26 mg/dL

## 2017-07-14 LAB — ABO/RH: ABO/RH(D): O POS

## 2017-07-14 LAB — POCT FERN TEST: POCT Fern Test: NEGATIVE

## 2017-07-14 LAB — AMNISURE RUPTURE OF MEMBRANE (ROM) NOT AT ARMC: Amnisure ROM: NEGATIVE

## 2017-07-14 LAB — TYPE AND SCREEN
ABO/RH(D): O POS
ANTIBODY SCREEN: NEGATIVE

## 2017-07-14 MED ORDER — DIPHENHYDRAMINE HCL 25 MG PO CAPS: 25.0000 mg | ORAL_CAPSULE | Freq: Four times a day (QID) | ORAL | Status: DC | PRN

## 2017-07-14 MED ORDER — LACTATED RINGERS IV SOLN
500.0000 mL | Freq: Once | INTRAVENOUS | Status: AC
  Administered 2017-07-14: 500 mL via INTRAVENOUS

## 2017-07-14 MED ORDER — PHENYLEPHRINE 40 MCG/ML (10ML) SYRINGE FOR IV PUSH (FOR BLOOD PRESSURE SUPPORT)
80.0000 ug | PREFILLED_SYRINGE | INTRAVENOUS | Status: DC | PRN
  Filled 2017-07-14: qty 5

## 2017-07-14 MED ORDER — ONDANSETRON HCL 4 MG/2ML IJ SOLN: 4.0000 mg | INTRAMUSCULAR | Status: DC | PRN

## 2017-07-14 MED ORDER — ACETAMINOPHEN 325 MG PO TABS: 650.0000 mg | ORAL_TABLET | ORAL | Status: DC | PRN

## 2017-07-14 MED ORDER — EPHEDRINE 5 MG/ML INJ
10.0000 mg | INTRAVENOUS | Status: DC | PRN
  Filled 2017-07-14: qty 2

## 2017-07-14 MED ORDER — SOD CITRATE-CITRIC ACID 500-334 MG/5ML PO SOLN: 30.0000 mL | ORAL | Status: DC | PRN

## 2017-07-14 MED ORDER — OXYTOCIN 40 UNITS IN LACTATED RINGERS INFUSION - SIMPLE MED
1.0000 m[IU]/min | INTRAVENOUS | Status: DC
  Administered 2017-07-14: 2 m[IU]/min via INTRAVENOUS
  Filled 2017-07-14: qty 1000

## 2017-07-14 MED ORDER — COCONUT OIL OIL
1.0000 "application " | TOPICAL_OIL | Status: DC | PRN
  Administered 2017-07-15: 1 via TOPICAL
  Filled 2017-07-14: qty 120

## 2017-07-14 MED ORDER — BENZOCAINE-MENTHOL 20-0.5 % EX AERO
1.0000 "application " | INHALATION_SPRAY | CUTANEOUS | Status: DC | PRN
  Administered 2017-07-14: 1 via TOPICAL
  Filled 2017-07-14: qty 56

## 2017-07-14 MED ORDER — TETANUS-DIPHTH-ACELL PERTUSSIS 5-2.5-18.5 LF-MCG/0.5 IM SUSP: 0.5000 mL | Freq: Once | INTRAMUSCULAR | Status: DC

## 2017-07-14 MED ORDER — OXYCODONE-ACETAMINOPHEN 5-325 MG PO TABS: 2.0000 | ORAL_TABLET | ORAL | Status: DC | PRN

## 2017-07-14 MED ORDER — FENTANYL 2.5 MCG/ML BUPIVACAINE 1/10 % EPIDURAL INFUSION (WH - ANES)
14.0000 mL/h | INTRAMUSCULAR | Status: DC | PRN
  Administered 2017-07-14: 14 mL/h via EPIDURAL
  Filled 2017-07-14: qty 100

## 2017-07-14 MED ORDER — OXYTOCIN 40 UNITS IN LACTATED RINGERS INFUSION - SIMPLE MED: 2.5000 [IU]/h | INTRAVENOUS | Status: DC

## 2017-07-14 MED ORDER — IBUPROFEN 600 MG PO TABS
600.0000 mg | ORAL_TABLET | Freq: Four times a day (QID) | ORAL | Status: DC
  Administered 2017-07-14 – 2017-07-16 (×7): 600 mg via ORAL
  Filled 2017-07-14 (×8): qty 1

## 2017-07-14 MED ORDER — SIMETHICONE 80 MG PO CHEW: 80.0000 mg | CHEWABLE_TABLET | ORAL | Status: DC | PRN

## 2017-07-14 MED ORDER — VITAMIN K1 1 MG/0.5ML IJ SOLN
INTRAMUSCULAR | Status: AC
  Filled 2017-07-14: qty 0.5

## 2017-07-14 MED ORDER — ONDANSETRON HCL 4 MG PO TABS: 4.0000 mg | ORAL_TABLET | ORAL | Status: DC | PRN

## 2017-07-14 MED ORDER — OXYCODONE HCL 5 MG PO TABS: 10.0000 mg | ORAL_TABLET | ORAL | Status: DC | PRN

## 2017-07-14 MED ORDER — PHENYLEPHRINE 40 MCG/ML (10ML) SYRINGE FOR IV PUSH (FOR BLOOD PRESSURE SUPPORT)
80.0000 ug | PREFILLED_SYRINGE | INTRAVENOUS | Status: DC | PRN
  Filled 2017-07-14: qty 5
  Filled 2017-07-14: qty 10

## 2017-07-14 MED ORDER — SENNOSIDES-DOCUSATE SODIUM 8.6-50 MG PO TABS
2.0000 | ORAL_TABLET | ORAL | Status: DC
  Administered 2017-07-15 (×2): 2 via ORAL
  Filled 2017-07-14 (×2): qty 2

## 2017-07-14 MED ORDER — OXYCODONE-ACETAMINOPHEN 5-325 MG PO TABS: 1.0000 | ORAL_TABLET | ORAL | Status: DC | PRN

## 2017-07-14 MED ORDER — OXYCODONE HCL 5 MG PO TABS: 5.0000 mg | ORAL_TABLET | ORAL | Status: DC | PRN

## 2017-07-14 MED ORDER — ONDANSETRON HCL 4 MG/2ML IJ SOLN: 4.0000 mg | Freq: Four times a day (QID) | INTRAMUSCULAR | Status: DC | PRN

## 2017-07-14 MED ORDER — DIBUCAINE 1 % RE OINT: 1.0000 "application " | TOPICAL_OINTMENT | RECTAL | Status: DC | PRN

## 2017-07-14 MED ORDER — LIDOCAINE HCL (PF) 1 % IJ SOLN
INTRAMUSCULAR | Status: DC | PRN
  Administered 2017-07-14: 6 mL via EPIDURAL
  Administered 2017-07-14: 4 mL

## 2017-07-14 MED ORDER — OXYTOCIN BOLUS FROM INFUSION
500.0000 mL | Freq: Once | INTRAVENOUS | Status: AC
  Administered 2017-07-14: 500 mL via INTRAVENOUS

## 2017-07-14 MED ORDER — PRENATAL MULTIVITAMIN CH
1.0000 | ORAL_TABLET | Freq: Every day | ORAL | Status: DC
  Administered 2017-07-15: 1 via ORAL
  Filled 2017-07-14 (×2): qty 1

## 2017-07-14 MED ORDER — LACTATED RINGERS IV SOLN: 500.0000 mL | INTRAVENOUS | Status: DC | PRN

## 2017-07-14 MED ORDER — TERBUTALINE SULFATE 1 MG/ML IJ SOLN
0.2500 mg | Freq: Once | INTRAMUSCULAR | Status: DC | PRN
Start: 2017-07-14 — End: 2017-07-16
  Filled 2017-07-14: qty 1

## 2017-07-14 MED ORDER — DIPHENHYDRAMINE HCL 50 MG/ML IJ SOLN: 12.5000 mg | INTRAMUSCULAR | Status: DC | PRN

## 2017-07-14 MED ORDER — WITCH HAZEL-GLYCERIN EX PADS: 1.0000 "application " | MEDICATED_PAD | CUTANEOUS | Status: DC | PRN

## 2017-07-14 MED ORDER — LIDOCAINE HCL (PF) 1 % IJ SOLN
30.0000 mL | INTRAMUSCULAR | Status: DC | PRN
  Filled 2017-07-14: qty 30

## 2017-07-14 MED ORDER — LACTATED RINGERS IV SOLN: INTRAVENOUS | Status: DC

## 2017-07-14 MED ORDER — FAMOTIDINE 20 MG PO TABS: 20.0000 mg | ORAL_TABLET | Freq: Two times a day (BID) | ORAL | Status: DC | PRN

## 2017-07-14 MED ORDER — EPHEDRINE 5 MG/ML INJ
10.0000 mg | INTRAVENOUS | Status: DC | PRN
Start: 2017-07-14 — End: 2017-07-16
  Filled 2017-07-14: qty 2

## 2017-07-14 NOTE — Anesthesia Preprocedure Evaluation (Signed)
Anesthesia Evaluation  Patient identified by MRN, date of birth, ID band Patient awake    Reviewed: Allergy & Precautions, H&P , Patient's Chart, lab work & pertinent test results  Airway Mallampati: II  TM Distance: >3 FB Neck ROM: full    Dental no notable dental hx.    Pulmonary    Pulmonary exam normal breath sounds clear to auscultation       Cardiovascular Exercise Tolerance: Good hypertension,  Rhythm:regular Rate:Normal     Neuro/Psych    GI/Hepatic   Endo/Other    Renal/GU      Musculoskeletal   Abdominal   Peds  Hematology   Anesthesia Other Findings Mild HTN  Reproductive/Obstetrics                             Anesthesia Physical Anesthesia Plan  ASA: III  Anesthesia Plan: Epidural   Post-op Pain Management:    Induction:   PONV Risk Score and Plan:   Airway Management Planned:   Additional Equipment:   Intra-op Plan:   Post-operative Plan:   Informed Consent: I have reviewed the patients History and Physical, chart, labs and discussed the procedure including the risks, benefits and alternatives for the proposed anesthesia with the patient or authorized representative who has indicated his/her understanding and acceptance.   Dental Advisory Given  Plan Discussed with:   Anesthesia Plan Comments: (Labs checked- platelets confirmed with RN in room. Fetal heart tracing, per RN, reported to be stable enough for sitting procedure. Discussed epidural, and patient consents to the procedure:  included risk of possible headache,backache, failed block, allergic reaction, and nerve injury. This patient was asked if she had any questions or concerns before the procedure started.)        Anesthesia Quick Evaluation

## 2017-07-14 NOTE — Lactation Note (Signed)
This note was copied from a baby's chart. Lactation Consultation Note  Patient Name: April Horn HQION'GToday's Date: 07/14/2017 Reason for consult: Initial assessment;Early term 37-38.6wks   Initial consult with mom of 2 hour old infant. Infant has BF once since birth. Mom reports her nipple is asymmetrical with latching. Mom to call out for feeding assistance at the next feeding. Infant currently asleep in mom's arms.   Mom reports her 197 and 489 yo did not latch, mom pumped for 1 year with first child and 3 months with 2nd child and lost her supply after gall bladder surgery.   Enc mom to feed infant STS 8-12 x in 24 hours at first feeding cues. Reviewed using pillow and head support with feeding.   BF Resources handout and LC Brochure given, mom informed of IP/OP services, BF Support Groups and LC phone #.   Older siblings just arrived to meet infant, Enc mom to call out for next feeding to assess latch, mom voiced understanding.   Maternal Data Formula Feeding for Exclusion: No Has patient been taught Hand Expression?: No Does the patient have breastfeeding experience prior to this delivery?: Yes  Feeding Feeding Type: Breast Fed Length of feed: 20 min  LATCH Score                   Interventions Interventions: Breast feeding basics reviewed;Skin to skin;Support pillows  Lactation Tools Discussed/Used WIC Program: No   Consult Status Consult Status: Follow-up Date: 07/15/17 Follow-up type: In-patient    April Horn 07/14/2017, 4:14 PM

## 2017-07-14 NOTE — Anesthesia Postprocedure Evaluation (Signed)
Anesthesia Post Note  Patient: April Horn  Procedure(s) Performed: AN AD HOC LABOR EPIDURAL     Patient location during evaluation: Mother Baby Anesthesia Type: Epidural Level of consciousness: awake and alert and oriented Pain management: pain level controlled Vital Signs Assessment: post-procedure vital signs reviewed and stable Respiratory status: spontaneous breathing and nonlabored ventilation Cardiovascular status: stable Postop Assessment: no headache, adequate PO intake, no backache, epidural receding, patient able to bend at knees and no apparent nausea or vomiting Anesthetic complications: no    Last Vitals:  Vitals:   07/14/17 1430 07/14/17 1446  BP: 110/76 114/77  Pulse: 91 96  Resp: 16 16  Temp:    SpO2:      Last Pain:  Vitals:   07/14/17 1426  TempSrc: Oral  PainSc:    Pain Goal:                 Land O'LakesMalinova,Silvano Garofano Hristova

## 2017-07-14 NOTE — Anesthesia Procedure Notes (Signed)
Epidural Patient location during procedure: OB  Staffing Anesthesiologist: Cristela BlueJackson, Cheo Selvey, MD  Preanesthetic Checklist Completed: patient identified, pre-op evaluation, timeout performed, IV checked, risks and benefits discussed and monitors and equipment checked  Epidural Patient position: sitting Prep: DuraPrep Patient monitoring: blood pressure and continuous pulse ox Approach: right paramedian Location: L3-L4 Injection technique: LOR air  Needle:  Needle type: Tuohy  Needle gauge: 17 G Needle insertion depth: 6 cm Catheter type: closed end flexible Catheter size: 19 Gauge Catheter at skin depth: 11 cm Test dose: negative  Assessment Sensory level: T8  Additional Notes Dosing of Epidural:  1st dose, through catheter ............................................[name].  Xylocaine 40 mg  2nd dose, through catheter, after waiting 3 minutes........[name].Xylocaine 60 mg    As each dose occurred, patient was free of IV sx; and patient exhibited no evidence of SA injection.  Patient is more comfortable after epidural dosed. Please see RN's note for documentation of vital signs,and FHR which are stable.  Patient reminded not to try to ambulate with numb legs, and that an RN must be present when she attempts to get up.

## 2017-07-14 NOTE — H&P (Signed)
36 y.o. [id number] @ 3417w1d presented to MAU with concern for ROM.  She was ruled out with neg fern and neg amnisure, however, was noted to have elevated BPs.  Preeclamptic labs were normal, but multiple BPs >140/90 over 4 hours. She was admitted for IOL for gestational hypertension at term .  Otherwise has good fetal movement and no bleeding.  Denies ha/ruq pain / bv  Of note, baby was transverse 2 weeks ago.  She was scheduled for an attempted ECV, but baby was noted to be cephalic on US the day prior to scheduled ECV last week.  US on admission confirmed cephalic presentation overnight per nursing report.  Past Medical History:  Diagnosis Date  . Medical history non-contributory     Past Surgical History:  Procedure Laterality Date  . bone growth Left    knee  20 years  . CHOLECYSTECTOMY      OB History  Gravida Para Term Preterm AB Living  3 2 2     2   SAB TAB Ectopic Multiple Live Births          2    # Outcome Date GA Lbr Len/2nd Weight Sex Delivery Anes PTL Lv  3 Current           2 Term 2011 6653w0d  2.863 kg (6 lb 5 oz) F Vag-Spont EPI  LIV  1 Term 2009 3280w0d  3.345 kg (7 lb 6 oz) M Vag-Spont EPI  LIV      Social History   Socioeconomic History  . Marital status: Married    Spouse name: Not on file  . Number of children: Not on file  . Years of education: Not on file  . Highest education level: Not on file  Tobacco Use  . Smoking status: Never Smoker  . Smokeless tobacco: Never Used  Substance and Sexual Activity  . Alcohol use: No    Alcohol/week: 0.0 oz    Frequency: Never  . Drug use: No  . Sexual activity: Yes  Other Topics Concern  . Not on file  Social History Narrative  . Not on file   Patient has no known allergies.    Prenatal Transfer Tool  Maternal Diabetes: No Genetic Screening: Normal Maternal Ultrasounds/Referrals: Normal Fetal Ultrasounds or other Referrals:  None Maternal Substance Abuse:  No Significant Maternal Medications:   None Significant Maternal Lab Results: Lab values include: Group B Strep negative  ABO, Rh: --/--/O POS (12/27 0530) Antibody: NEG (12/27 0530) Rubella: Immune (06/26 0000) RPR: Nonreactive (06/26 0000)  HBsAg: Negative (06/26 0000)  HIV: Non-reactive (06/26 0000)  GBS: Negative (12/07 0000)        Vitals:   07/14/17 0708 07/14/17 0737  BP: 112/75 114/77  Pulse: 88 96  Resp:  18  Temp:  98.4 F (36.9 C)  SpO2:       General:  NAD Abdomen:  soft, gravid, EFW 8-8.5# Ex:  trace edema SVE:  3/80/high per RN FHTs:  140s, mod var, + accels, no decels, cat 1 Toco:  q4-6 min  12/13 US: EFW 7lb 8oz (84%), afi 17   A/P   36 y.o. [id number] 7217w1d presents with gestational hypertension at term Cont pitocin for IOL GHTN-- BPs mild range, preeclampsia labs wnl.  Will monitor for progression to severe disease Desires epidural now for pain control FSR/ vtx/ GBS neg  April Horn April Horn

## 2017-07-14 NOTE — MAU Provider Note (Signed)
History   [id number]   Chief Complaint  Patient presents with  . Rupture of Membranes    HPI April Horn A Chasen is a 36 y.o. female  G3P2002 @38 .1 wks here with report of LOF around 2320.  Fluid was clear and large amount soaked her pants. Leaking of fluid has continued, just small amt. Pt reports irregular contractions. She denies vaginal bleeding. Last intercourse was not recently. She reports good fetal movement. All other systems negative.    Patient's last menstrual period was 10/20/2016.  OB History  Gravida Para Term Preterm AB Living  3 2 2     2   SAB TAB Ectopic Multiple Live Births          2    # Outcome Date GA Lbr Len/2nd Weight Sex Delivery Anes PTL Lv  3 Current           2 Term 2011 3066w0d  6 lb 5 oz (2.863 kg) F Vag-Spont EPI  LIV  1 Term 2009 6733w0d  7 lb 6 oz (3.345 kg) M Vag-Spont EPI  LIV      Past Medical History:  Diagnosis Date  . Medical history non-contributory     Family History  Problem Relation Age of Onset  . Hypertension Brother   . Hyperlipidemia Brother     Social History   Socioeconomic History  . Marital status: Married    Spouse name: None  . Number of children: None  . Years of education: None  . Highest education level: None  Social Needs  . Financial resource strain: None  . Food insecurity - worry: None  . Food insecurity - inability: None  . Transportation needs - medical: None  . Transportation needs - non-medical: None  Occupational History  . None  Tobacco Use  . Smoking status: Never Smoker  . Smokeless tobacco: Never Used  Substance and Sexual Activity  . Alcohol use: None  . Drug use: None  . Sexual activity: None  Other Topics Concern  . None  Social History Narrative  . None    No Known Allergies  No current facility-administered medications on file prior to encounter.    No current outpatient medications on file prior to encounter.     Review of Systems  Eyes: Negative for visual  disturbance.  Gastrointestinal: Negative for abdominal pain.  Genitourinary: Positive for vaginal discharge. Negative for vaginal bleeding.  Neurological: Negative for headaches.     Physical Exam   Vitals:   07/14/17 0411 07/14/17 0416 07/14/17 0421 07/14/17 0426  BP:      Pulse:      Resp:      Temp:      TempSrc:      SpO2: 98% 98% 98% 97%  Weight:      Height:       Patient Vitals for the past 24 hrs:  BP Temp Temp src Pulse Resp SpO2 Height Weight  07/14/17 0426 - - - - - 97 % - -  07/14/17 0421 - - - - - 98 % - -  07/14/17 0416 - - - - - 98 % - -  07/14/17 0411 - - - - - 98 % - -  07/14/17 0406 - - - - - 99 % - -  07/14/17 0401 (!) 128/91 - - (!) 114 - - - -  07/14/17 0346 (!) 135/104 - - (!) 113 - - - -  07/14/17 0331 (!) 140/96 - - (!) 106 - - - -  07/14/17 0300 126/90 - - (!) 104 - - - -  07/14/17 0246 131/87 - - (!) 105 - - - -  07/14/17 0228 (!) 132/91 - - (!) 108 - - - -  07/14/17 0156 (!) 125/93 - - (!) 107 - - - -  07/14/17 0048 - - - - - 96 % - -  07/14/17 0043 - - - - - 98 % - -  07/14/17 0038 - - - - - 98 % - -  07/14/17 0021 - - - - - - 5\' 5"  (1.651 m) 195 lb (88.5 kg)  07/14/17 0017 (!) 144/98 98.5 F (36.9 C) Oral (!) 102 16 99 % - -    Physical Exam  Nursing note and vitals reviewed. Constitutional: She is oriented to person, place, and time. She appears well-developed and well-nourished. No distress.  HENT:  Head: Normocephalic and atraumatic.  Neck: Normal range of motion.  Cardiovascular: Normal rate.  Respiratory: Effort normal. No respiratory distress.  Genitourinary:  Genitourinary Comments: SSE: mucous discharge, pool neg, fern neg SVE 2/60/-2, vtx  Musculoskeletal: Normal range of motion.  Neurological: She is alert and oriented to person, place, and time.  Skin: Skin is warm and dry.  Psychiatric: She has a normal mood and affect.  EFM: 155 bpm, mod variability, + accels, no decels Toco: irregular  Results for orders placed or  performed during the hospital encounter of 07/14/17 (from the past 24 hour(s))  Amnisure rupture of membrane (rom)not at [facility]     Status: None   Collection Time: 07/14/17  2:25 AM  Result Value Ref Range   Amnisure ROM NEGATIVE   POCT fern test     Status: Normal   Collection Time: 07/14/17  2:40 AM  Result Value Ref Range   POCT Fern Test Negative = intact amniotic membranes   Protein / creatinine ratio, urine     Status: None   Collection Time: 07/14/17  2:47 AM  Result Value Ref Range   Creatinine, Urine 26.00 mg/dL   Total Protein, Urine <6 mg/dL   Protein Creatinine Ratio        0.00 - 0.15 mg/mg[Cre]  CBC     Status: None   Collection Time: 07/14/17  2:48 AM  Result Value Ref Range   WBC 9.7 4.0 - 10.5 K/uL   RBC 4.15 3.87 - 5.11 MIL/uL   Hemoglobin 13.0 12.0 - 15.0 g/dL   HCT 60.4 54.0 - 98.1 %   MCV 87.7 78.0 - 100.0 fL   MCH 31.3 26.0 - 34.0 pg   MCHC 35.7 30.0 - 36.0 g/dL   RDW 19.1 47.8 - 29.5 %   Platelets 224 150 - 400 K/uL  Comprehensive metabolic panel     Status: Abnormal   Collection Time: 07/14/17  2:48 AM  Result Value Ref Range   Sodium 134 (L) 135 - 145 mmol/L   Potassium 4.5 3.5 - 5.1 mmol/L   Chloride 103 101 - 111 mmol/L   CO2 22 22 - 32 mmol/L   Glucose, Bld 97 65 - 99 mg/dL   BUN 10 6 - 20 mg/dL   Creatinine, Ser 6.21 0.44 - 1.00 mg/dL   Calcium 9.4 8.9 - 30.8 mg/dL   Total Protein 6.4 (L) 6.5 - 8.1 g/dL   Albumin 3.3 (L) 3.5 - 5.0 g/dL   AST 18 15 - 41 U/L   ALT 19 14 - 54 U/L   Alkaline Phosphatase 103 38 - 126 U/L  Total Bilirubin 0.8 0.3 - 1.2 mg/dL   GFR calc non Af Amer >60 >60 mL/min   GFR calc Af Amer >60 >60 mL/min   Anion gap 9 5 - 15    MAU Course  Procedures  MDM Labs ordered and reviewed. No evidence of SROM or labor however pt found to have mild HTN. Pre-e labs negative but meets criteria for GHTN. Consult with Dr. Dareen PianoAnderson, plan for admit and IOL.  Assessment and Plan   1. Gestational hypertension, third trimester     [facility]. Mariane DuvalAnderson    Billye Nydam, BaltaMelanie, CNM 07/14/2017 4:42 AM

## 2017-07-14 NOTE — Progress Notes (Signed)
Patient reports having an unstable lie. Unable to feel suture during cervical check. Dr. Dareen PianoAnderson notified. Dr. Gwen PoundsAndersonrequests bedside ultrasound to assess fetal lie via [facility], before starting induction.

## 2017-07-14 NOTE — Anesthesia Pain Management Evaluation Note (Signed)
  CRNA Pain Management Visit Note  Patient: April Horn, 36 y.o., female  "Hello I am a [facility] of the [facility]. We have an anesthesia team available at all times to provide care throughout the hospital, including epidural management and anesthesia for C-section. I don't know your plan for the delivery whether it a natural birth, water birth, IV sedation, nitrous supplementation, doula or epidural, but we want to meet your pain goals."   1.Was your pain managed to your expectations on prior hospitalizations?   Yes   2.What is your expectation for pain management during this hospitalization?     Epidural  3.How can we help you reach that goal? epidural  Record the patient's initial score and the patient's pain goal.   Pain: 0  Pain Goal: 4 [facility] wants you to be able to say your pain was always managed very well.  Joli Koob 07/14/2017

## 2017-07-14 NOTE — MAU Note (Signed)
Pt reports water broke at 11:20p-clear fluid. Reports non painful contractions 5-10 mins. Reports good fetal movement. States baby was transverse at 36 weeks but had turned at 37 weeks. States cervix was 2cm last week.

## 2017-07-15 LAB — CBC
HEMATOCRIT: 33.8 % — AB (ref 36.0–46.0)
Hemoglobin: 11.6 g/dL — ABNORMAL LOW (ref 12.0–15.0)
MCH: 30.4 pg (ref 26.0–34.0)
MCHC: 34.3 g/dL (ref 30.0–36.0)
MCV: 88.7 fL (ref 78.0–100.0)
Platelets: 178 10*3/uL (ref 150–400)
RBC: 3.81 MIL/uL — ABNORMAL LOW (ref 3.87–5.11)
RDW: 13.4 % (ref 11.5–15.5)
WBC: 10.8 10*3/uL — ABNORMAL HIGH (ref 4.0–10.5)

## 2017-07-15 NOTE — Addendum Note (Signed)
Addendum  created 07/15/17 2126 by Mal AmabileFoster, Naftuli Dalsanto, MD   Intraprocedure Staff edited

## 2017-07-15 NOTE — Progress Notes (Signed)
PPD#1 Pt without complaints. States she feels well and is ready for discharge.  Her b/ps are all in nl range.  IMP/PPD#1 doing well Plan/ Will discharge to home           Recheck b/p in 4-6 days.

## 2017-07-15 NOTE — Lactation Note (Signed)
This note was copied from a baby's chart. Lactation Consultation Note  Patient Name: April Horn ZOXWR'UToday's Date: 07/15/2017 Reason for consult: Follow-up assessment;Nipple pain/trauma  Visited with Mom on day of discharge, baby 20 hrs old.  Mom states her nipples are sore and she is having some challenge getting baby to latch deep enough.  Baby sleeping, but offered to unwrap her and assist and assess baby at the breast.  Mom positioned baby well in football hold on right breast.  Hand expression revealed easy flow of colostrum.   Mom encouraged to wait for baby to open wider, and adjusted hand placement on breast to facilitate a deeper grasp to breast. Multiple swallowing identified.  Assisted Mom with alternate breast compression.    No skin breakdown on nipples.  Coconut oil given with instructions on use.  Nipple rounded pc.  Encouraged STS and cue based feedings, 8-12 feedings per 24 hrs.   Engorgement prevention and treatment discussed.  Mom aware of OP Lactation support available and encouraged her to call prn.   Feeding Feeding Type: Breast Fed Length of feed: 15 min  LATCH Score Latch: Grasps breast easily, tongue down, lips flanged, rhythmical sucking.  Audible Swallowing: Spontaneous and intermittent  Type of Nipple: Everted at rest and after stimulation  Comfort (Breast/Nipple): Filling, red/small blisters or bruises, mild/mod discomfort  Hold (Positioning): No assistance needed to correctly position infant at breast.  LATCH Score: 9  Interventions Interventions: Breast feeding basics reviewed;Assisted with latch;Skin to skin;Breast massage;Hand express;Adjust position;Support pillows;Position options;Expressed milk;Coconut oil  Lactation Tools Discussed/Used     Consult Status Consult Status: Complete Date: 07/15/17 Follow-up type: Call as needed    Judee ClaraSmith, April Horn E 07/15/2017, 10:29 AM

## 2017-07-16 NOTE — Lactation Note (Signed)
This note was copied from a baby's chart. Lactation Consultation Note Experienced BF mom pumped exclusive for 1 yr to her 1st child, 2nd child pumped and formula fed for 6 months. This baby mom hopes to exclusively BF. Mom states baby is feeding well. Had a couple of sleepy times in the afternoon, but has made up for it. Mom has everted nipples w/small abrasion to Lt. Nipple, c/o soreness. Gave mom comfort gels. Mom has wide space between breast. Chest doesn't have connective tissue to breast bone in center, it has a curve from one breast to the other as if may have connective tissue across breast.  Easily hand expressed colostrum. Encouraged mom to assess before and after BF for transfer. Cont. To document I&O. Discussed STS, cluster feeding, supply and demand, filling, transitional milk, engorgement, clogged ducts, pumping, milk storage and breast massage. Reminded mom of LC OP services. Mom had no further questions.  Patient Name: April Horn JXBJY'NToday's Date: 07/16/2017 Reason for consult: Follow-up assessment   Maternal Data    Feeding    LATCH Score       Type of Nipple: Everted at rest and after stimulation  Comfort (Breast/Nipple): Filling, red/small blisters or bruises, mild/mod discomfort  Hold (Positioning): No assistance needed to correctly position infant at breast.     Interventions Interventions: Breast feeding basics reviewed;Breast compression;Breast massage;Hand express;Comfort gels  Lactation Tools Discussed/Used     Consult Status Consult Status: Complete Date: 07/16/17    [name], [name] 07/16/2017, 1:03 AM

## 2017-07-25 NOTE — Discharge Summary (Signed)
Obstetric Discharge Summary Reason for Admission: induction of labor Prenatal Procedures: ultrasound Intrapartum Procedures: spontaneous vaginal delivery Postpartum Procedures: none Complications-Operative and Postpartum: 1st degree perineal laceration Hemoglobin  Date Value Ref Range Status  07/15/2017 11.6 (L) 12.0 - 15.0 g/dL Final   HCT  Date Value Ref Range Status  07/15/2017 33.8 (L) 36.0 - 46.0 % Final    Physical Exam:  General: alert and cooperative Lochia: appropriate Uterine Fundus: firm   Discharge Diagnoses: Term Pregnancy-delivered and gestational hypertension  Discharge Information: Date: 07/25/2017 Activity: pelvic rest Diet: routine Medications: PNV and Ibuprofen Condition: stable Instructions: refer to practice specific booklet Discharge to: home   Newborn Data: Live born female  Birth Weight: 7 lb 12.3 oz (3524 g) APGAR: 9, 9  Newborn Delivery   Birth date/time:  07/14/2017 13:36:00 Delivery type:  Vaginal, Spontaneous     Home with mother.  Levi AlandANDERSON,Kerby Hockley E 07/25/2017, 7:22 PM

## 2020-04-14 ENCOUNTER — Encounter: Payer: Self-pay | Admitting: Family Medicine

## 2020-04-14 ENCOUNTER — Ambulatory Visit: Payer: Commercial Managed Care - PPO | Admitting: Family Medicine

## 2020-04-14 ENCOUNTER — Other Ambulatory Visit: Payer: Self-pay

## 2020-04-14 VITALS — BP 100/70 | HR 83 | Temp 98.0°F | Ht 64.0 in | Wt 167.0 lb

## 2020-04-14 DIAGNOSIS — N938 Other specified abnormal uterine and vaginal bleeding: Secondary | ICD-10-CM

## 2020-04-14 DIAGNOSIS — S39012A Strain of muscle, fascia and tendon of lower back, initial encounter: Secondary | ICD-10-CM

## 2020-04-14 DIAGNOSIS — Z1321 Encounter for screening for nutritional disorder: Secondary | ICD-10-CM | POA: Diagnosis not present

## 2020-04-14 DIAGNOSIS — Z23 Encounter for immunization: Secondary | ICD-10-CM

## 2020-04-14 DIAGNOSIS — Z0000 Encounter for general adult medical examination without abnormal findings: Secondary | ICD-10-CM

## 2020-04-14 DIAGNOSIS — Z13220 Encounter for screening for lipoid disorders: Secondary | ICD-10-CM | POA: Diagnosis not present

## 2020-04-14 NOTE — Patient Instructions (Addendum)
Good to see you today   Kegel Exercises  Kegel exercises can help strengthen your pelvic floor muscles. The pelvic floor is a group of muscles that support your rectum, small intestine, and bladder. In females, pelvic floor muscles also help support the womb (uterus). These muscles help you control the flow of urine and stool. Kegel exercises are painless and simple, and they do not require any equipment. Your provider may suggest Kegel exercises to:  Improve bladder and bowel control.  Improve sexual response.  Improve weak pelvic floor muscles after surgery to remove the uterus (hysterectomy) or pregnancy (females).  Improve weak pelvic floor muscles after prostate gland removal or surgery (males). Kegel exercises involve squeezing your pelvic floor muscles, which are the same muscles you squeeze when you try to stop the flow of urine or keep from passing gas. The exercises can be done while sitting, standing, or lying down, but it is best to vary your position. Exercises How to do Kegel exercises: 1. Squeeze your pelvic floor muscles tight. You should feel a tight lift in your rectal area. If you are a female, you should also feel a tightness in your vaginal area. Keep your stomach, buttocks, and legs relaxed. 2. Hold the muscles tight for up to 10 seconds. 3. Breathe normally. 4. Relax your muscles. 5. Repeat as told by your health care provider. Repeat this exercise daily as told by your health care provider. Continue to do this exercise for at least 4-6 weeks, or for as long as told by your health care provider. You may be referred to a physical therapist who can help you learn more about how to do Kegel exercises. Depending on your condition, your health care provider may recommend:  Varying how long you squeeze your muscles.  Doing several sets of exercises every day.  Doing exercises for several weeks.  Making Kegel exercises a part of your regular exercise routine. This  information is not intended to replace advice given to you by your health care provider. Make sure you discuss any questions you have with your health care provider. Document Revised: 02/22/2018 Document Reviewed: 02/22/2018 Elsevier Patient Education  2020 [employer].

## 2020-04-14 NOTE — Progress Notes (Signed)
Subjective:    Patient ID: April Horn, female    DOB: April 06, 1981, 39 y.o.   MRN: [id number]  HPI Chief Complaint  Patient presents with  . New Patient (Initial Visit)    obgyn [facility] Dr [facility] Cloud    This is a 39 yo female who presents today to establish care. Married, 2, 10, 39 yo. Is a speech therapist. Husband works for [employer], she needs form for CPE filled out today. Enjoys shopping, decorating.   Last CPE- 11/20 Pap- gyn Tdap- ? gyn Flu- will get next month Covid 19 vaccine- fully vaccinated Eye- over due Dental- regular Exercise- boot camp 3x/ week  Back pain- three weeks ago was doing boot camp and had pull of right side of low back. Last week, had pain on left side with twisting. Relieved with ibuprofen, topical treatment.    Review of Systems  Constitutional: Negative.   HENT: Negative.   Eyes: Negative.   Respiratory: Negative.   Cardiovascular: Negative.   Gastrointestinal: Negative.   Endocrine: Negative.   Genitourinary: Positive for menstrual problem (heavy periods, regular, 5-7 days).  Musculoskeletal: Positive for back pain.  Skin: Negative.   Allergic/Immunologic: Negative.   Neurological: Negative.   Hematological: Negative.   Psychiatric/Behavioral: Positive for sleep disturbance (sleeps 5-6 hours a night).       Objective:   Physical Exam Vitals reviewed.  Constitutional:      General: She is not in acute distress.    Appearance: Normal appearance. She is normal weight. She is not ill-appearing, toxic-appearing or diaphoretic.  HENT:     Head: Normocephalic and atraumatic.     Right Ear: External ear normal.     Left Ear: External ear normal.  Eyes:     Conjunctiva/sclera: Conjunctivae normal.  Cardiovascular:     Rate and Rhythm: Normal rate and regular rhythm.     Heart sounds: Normal heart sounds.  Pulmonary:     Effort: Pulmonary effort is normal.     Breath sounds: Normal breath sounds.  Musculoskeletal:      Cervical back: Normal range of motion and neck supple.     Right lower leg: No edema.     Left lower leg: No edema.  Skin:    General: Skin is warm and dry.  Neurological:     Mental Status: She is alert and oriented to person, place, and time.  Psychiatric:        Mood and Affect: Mood normal.        Behavior: Behavior normal.        Thought Content: Thought content normal.        Judgment: Judgment normal.       BP 100/70   Pulse 83   Temp 98 F (36.7 C) (Temporal)   Ht 5\' 4"  (1.626 m)   Wt 167 lb (75.8 kg)   LMP 04/07/2020 (Exact Date)   SpO2 100%   BMI 28.67 kg/m  Wt Readings from Last 3 Encounters:  04/14/20 167 lb (75.8 kg)  07/14/17 195 lb (88.5 kg)  04/06/15 179 lb (81.2 kg)   Depression screen [facility] 2/9 04/14/2020 04/06/2015  Decreased Interest 0 0  Down, Depressed, Hopeless 0 0  PHQ - 2 Score 0 0       Assessment & Plan:  1. Annual physical exam - Discussed and encouraged healthy lifestyle choices- adequate sleep, regular exercise, stress management and healthy food choices.    2. Dysfunctional uterine bleeding - she has upcoming  appointment with gyn, discussed options including OCP, ablation - CBC with Differential - TSH - Ferritin - Comprehensive metabolic panel  3. Encounter for vitamin deficiency screening - Vitamin D, 25-hydroxy  4. Screening for lipid disorders - Lipid Panel  5. Need for influenza vaccination - Flu Vaccine QUAD 6+ mos PF IM (Fluarix Quad PF)  6. Strain of lumbar region, initial encounter - discussed otc NSAID use, heat/ ice, stretching, importance of core strengthening exercises  This visit occurred during the SARS-CoV-2 public health emergency.  Safety protocols were in place, including screening questions prior to the visit, additional usage of staff PPE, and extensive cleaning of exam room while observing appropriate contact time as indicated for disinfecting solutions.      Olean Ree, FNP-BC  [facility], [facility]  04/15/2020 8:35 PM

## 2020-04-15 LAB — COMPREHENSIVE METABOLIC PANEL
ALT: 48 U/L — ABNORMAL HIGH (ref 0–35)
AST: 31 U/L (ref 0–37)
Albumin: 5.1 g/dL (ref 3.5–5.2)
Alkaline Phosphatase: 46 U/L (ref 39–117)
BUN: 16 mg/dL (ref 6–23)
CO2: 29 mEq/L (ref 19–32)
Calcium: 10.2 mg/dL (ref 8.4–10.5)
Chloride: 99 mEq/L (ref 96–112)
Creatinine, Ser: 0.71 mg/dL (ref 0.40–1.20)
GFR: 91.34 mL/min (ref >60.00)
Glucose, Bld: 84 mg/dL (ref 70–99)
Potassium: 4.1 mEq/L (ref 3.5–5.1)
Sodium: 137 mEq/L (ref 135–145)
Total Bilirubin: 0.6 mg/dL (ref 0.2–1.2)
Total Protein: 7.8 g/dL (ref 6.0–8.3)

## 2020-04-15 LAB — CBC WITH DIFFERENTIAL/PLATELET
Basophils Absolute: 0 10*3/uL (ref 0.0–0.1)
Basophils Relative: 0.6 % (ref 0.0–3.0)
Eosinophils Absolute: 0.1 10*3/uL (ref 0.0–0.7)
Eosinophils Relative: 0.8 % (ref 0.0–5.0)
HCT: 39.6 % (ref 36.0–46.0)
Hemoglobin: 13.9 g/dL (ref 12.0–15.0)
Lymphocytes Relative: 25.4 % (ref 12.0–46.0)
Lymphs Abs: 2 10*3/uL (ref 0.7–4.0)
MCHC: 35 g/dL (ref 30.0–36.0)
MCV: 85.8 fl (ref 78.0–100.0)
Monocytes Absolute: 0.6 10*3/uL (ref 0.1–1.0)
Monocytes Relative: 7.1 % (ref 3.0–12.0)
Neutro Abs: 5.2 10*3/uL (ref 1.4–7.7)
Neutrophils Relative %: 66.1 % (ref 43.0–77.0)
Platelets: 293 10*3/uL (ref 150.0–400.0)
RBC: 4.62 Mil/uL (ref 3.87–5.11)
RDW: 12.4 % (ref 11.5–15.5)
WBC: 7.8 10*3/uL (ref 4.0–10.5)

## 2020-04-15 LAB — VITAMIN D 25 HYDROXY (VIT D DEFICIENCY, FRACTURES): VITD: 25.29 ng/mL — ABNORMAL LOW (ref 30.00–100.00)

## 2020-04-15 LAB — LIPID PANEL
Cholesterol: 163 mg/dL (ref 0–200)
HDL: 52.4 mg/dL (ref >39.00)
LDL Cholesterol: 95 mg/dL (ref 0–99)
NonHDL: 110.22
Total CHOL/HDL Ratio: 3
Triglycerides: 76 mg/dL (ref 0.0–149.0)
VLDL: 15.2 mg/dL (ref 0.0–40.0)

## 2020-04-15 LAB — FERRITIN: Ferritin: 43.1 ng/mL (ref 10.0–291.0)

## 2020-04-15 LAB — TSH: TSH: 0.98 u[IU]/mL (ref 0.35–4.50)

## 2020-04-19 ENCOUNTER — Encounter: Payer: Self-pay | Admitting: Family Medicine

## 2020-05-09 ENCOUNTER — Ambulatory Visit: Payer: Commercial Managed Care - PPO | Admitting: Family Medicine

## 2021-05-22 DIAGNOSIS — Z23 Encounter for immunization: Secondary | ICD-10-CM | POA: Diagnosis not present

## 2021-05-27 ENCOUNTER — Telehealth: Payer: Commercial Managed Care - PPO | Admitting: Physician Assistant

## 2021-05-27 DIAGNOSIS — B9689 Other specified bacterial agents as the cause of diseases classified elsewhere: Secondary | ICD-10-CM

## 2021-05-27 DIAGNOSIS — J019 Acute sinusitis, unspecified: Secondary | ICD-10-CM | POA: Diagnosis not present

## 2021-05-27 NOTE — Patient Instructions (Signed)
April Horn, thank you for joining [name], PA-C for today's virtual visit.  While this provider is not your primary care provider (PCP), if your PCP is located in our provider database this encounter information will be shared with them immediately following your visit.  Consent: (Patient) April Horn provided verbal consent for this virtual visit at the beginning of the encounter.  Current Medications:  Current Outpatient Medications:    Multiple Vitamin (MULTIVITAMIN ADULT PO), Take by mouth., Disp: , Rfl:    Prenatal Vit-Fe Fumarate-FA (PRENATAL MULTIVITAMIN) TABS tablet, Take 1 tablet by mouth daily at 12 noon. (Patient not taking: Reported on 04/14/2020), Disp: , Rfl:    Medications ordered in this encounter:  No orders of the defined types were placed in this encounter.    *If you need refills on other medications prior to your next appointment, please contact your pharmacy*  Follow-Up: Call back or seek an in-person evaluation if the symptoms worsen or if the condition fails to improve as anticipated.  Other Instructions Please take antibiotic as directed.  Increase fluid intake.  Use Saline nasal spray.  Take a daily multivitamin. Continue your Fluticasone nasal spray.  Place a humidifier in the bedroom.  Please call or return clinic if symptoms are not improving.  Sinusitis Sinusitis is redness, soreness, and swelling (inflammation) of the paranasal sinuses. Paranasal sinuses are air pockets within the bones of your face (beneath the eyes, the middle of the forehead, or above the eyes). In healthy paranasal sinuses, mucus is able to drain out, and air is able to circulate through them by way of your nose. However, when your paranasal sinuses are inflamed, mucus and air can become trapped. This can allow bacteria and other germs to grow and cause infection. Sinusitis can develop quickly and last only a short time (acute) or continue over a long period  (chronic). Sinusitis that lasts for more than 12 weeks is considered chronic.  CAUSES  Causes of sinusitis include: Allergies. Structural abnormalities, such as displacement of the cartilage that separates your nostrils (deviated septum), which can decrease the air flow through your nose and sinuses and affect sinus drainage. Functional abnormalities, such as when the small hairs (cilia) that line your sinuses and help remove mucus do not work properly or are not present. SYMPTOMS  Symptoms of acute and chronic sinusitis are the same. The primary symptoms are pain and pressure around the affected sinuses. Other symptoms include: Upper toothache. Earache. Headache. Bad breath. Decreased sense of smell and taste. A cough, which worsens when you are lying flat. Fatigue. Fever. Thick drainage from your nose, which often is green and may contain pus (purulent). Swelling and warmth over the affected sinuses. DIAGNOSIS  Your caregiver will perform a physical exam. During the exam, your caregiver may: Look in your nose for signs of abnormal growths in your nostrils (nasal polyps). Tap over the affected sinus to check for signs of infection. View the inside of your sinuses (endoscopy) with a special imaging device with a light attached (endoscope), which is inserted into your sinuses. If your caregiver suspects that you have chronic sinusitis, one or more of the following tests may be recommended: Allergy tests. Nasal culture A sample of mucus is taken from your nose and sent to a lab and screened for bacteria. Nasal cytology A sample of mucus is taken from your nose and examined by your caregiver to determine if your sinusitis is related to an allergy. TREATMENT  Most cases  of acute sinusitis are related to a viral infection and will resolve on their own within 10 days. Sometimes medicines are prescribed to help relieve symptoms (pain medicine, decongestants, nasal steroid sprays, or saline  sprays).  However, for sinusitis related to a bacterial infection, your caregiver will prescribe antibiotic medicines. These are medicines that will help kill the bacteria causing the infection.  Rarely, sinusitis is caused by a fungal infection. In theses cases, your caregiver will prescribe antifungal medicine. For some cases of chronic sinusitis, surgery is needed. Generally, these are cases in which sinusitis recurs more than 3 times per year, despite other treatments. HOME CARE INSTRUCTIONS  Drink plenty of water. Water helps thin the mucus so your sinuses can drain more easily. Use a humidifier. Inhale steam 3 to 4 times a day (for example, sit in the bathroom with the shower running). Apply a warm, moist washcloth to your face 3 to 4 times a day, or as directed by your caregiver. Use saline nasal sprays to help moisten and clean your sinuses. Take over-the-counter or prescription medicines for pain, discomfort, or fever only as directed by your caregiver. SEEK IMMEDIATE MEDICAL CARE IF: You have increasing pain or severe headaches. You have nausea, vomiting, or drowsiness. You have swelling around your face. You have vision problems. You have a stiff neck. You have difficulty breathing. MAKE SURE YOU:  Understand these instructions. Will watch your condition. Will get help right away if you are not doing well or get worse. Document Released: 07/05/2005 Document Revised: 09/27/2011 Document Reviewed: 07/20/2011 [facility] Patient Information 2014 [place], [place].    If you have been instructed to have an in-person evaluation today at a local Urgent Care facility, please use the link below. It will take you to a list of all of our available [facility], including address, phone number and hours of operation. Please do not delay care.  [facility]  If you or a family member do not have a primary care provider, use the link below to schedule a visit and  establish care. When you choose a [facility] primary care physician or advanced practice provider, you gain a long-term partner in health. Find a Primary Care Provider  Learn more about [facility] in-office and virtual care options:  - Get Care Now

## 2021-05-27 NOTE — Progress Notes (Signed)
Virtual Visit Consent   April Horn, you are scheduled for a virtual visit with a [facility] provider today.     Just as with appointments in the office, your consent must be obtained to participate.  Your consent will be active for this visit and any virtual visit you may have with one of our providers in the next 365 days.     If you have a MyChart account, a copy of this consent can be sent to you electronically.  All virtual visits are billed to your insurance company just like a traditional visit in the office.    As this is a virtual visit, video technology does not allow for your provider to perform a traditional examination.  This may limit your provider's ability to fully assess your condition.  If your provider identifies any concerns that need to be evaluated in person or the need to arrange testing (such as labs, EKG, etc.), we will make arrangements to do so.     Although advances in technology are sophisticated, we cannot ensure that it will always work on either your end or our end.  If the connection with a video visit is poor, the visit may have to be switched to a telephone visit.  With either a video or telephone visit, we are not always able to ensure that we have a secure connection.     I need to obtain your verbal consent now.   Are you willing to proceed with your visit today?    [name] has provided verbal consent on 05/27/2021 for a virtual visit (video or telephone).   [name], [place]   Date: 05/27/2021 12:07 PM   Virtual Visit via Video Note   I, [name], connected with  CHARLSIE FLEEGER  ([id number], [birth year]) on 05/27/21 at 12:00 PM EST by a video-enabled telemedicine application and verified that I am speaking with the correct person using two identifiers.  Location: Patient: Virtual Visit Location Patient: Home Provider: Virtual Visit Location Provider: Home Office   I discussed the limitations of evaluation and  management by telemedicine and the availability of in person appointments. The patient expressed understanding and agreed to proceed.    History of Present Illness: April Horn is a 40 y.o. who identifies as a female who was assigned female at birth, and is being seen today for possible sinusitis. Notes some mild nasal/head symptoms recently with abrupt change in past 48 hours with substantial maxillary inus pressure with significant left-sided maxillary pain and aching in upper teeth. Denies fever, chills, chest congestion. Occasional , dry cough. Denies SOB. Denies recent travel or sick contact.   Has taken OTC decongestant without improvement. [name]  HPI: HPI  Problems:  Patient Active Problem List   Diagnosis Date Noted   Gestational hypertension 07/14/2017    Allergies: No Known Allergies Medications:  Current Outpatient Medications:    Multiple Vitamin (MULTIVITAMIN ADULT PO), Take by mouth., Disp: , Rfl:    Prenatal Vit-Fe Fumarate-FA (PRENATAL MULTIVITAMIN) TABS tablet, Take 1 tablet by mouth daily at 12 noon. (Patient not taking: Reported on 04/14/2020), Disp: , Rfl:   Observations/Objective: Patient is well-developed, well-nourished in no acute distress.  Resting comfortably at home.  Head is normocephalic, atraumatic.  No labored breathing. Speech is clear and coherent with logical content.  Patient is alert and oriented at baseline.   Assessment and Plan: 1. Acute bacterial sinusitis Rx Augmentin.  Increase fluids.  Rest.  Saline  nasal spray.  Probiotic.  Mucinex as directed.  Humidifier in bedroom. Continue Flonase. Start Zyrtec-D.  Call or return to clinic if symptoms are not improving.   Follow Up Instructions: I discussed the assessment and treatment plan with the patient. The patient was provided an opportunity to ask questions and all were answered. The patient agreed with the plan and demonstrated an understanding of the instructions.  A copy of instructions were  sent to the patient via MyChart unless otherwise noted below.   The patient was advised to call back or seek an in-person evaluation if the symptoms worsen or if the condition fails to improve as anticipated.  Time:  I spent 10 minutes with the patient via telehealth technology discussing the above problems/concerns.    [name], PA-C

## 2021-05-28 ENCOUNTER — Other Ambulatory Visit: Payer: Self-pay | Admitting: Physician Assistant

## 2021-05-28 ENCOUNTER — Telehealth: Payer: Self-pay

## 2021-05-28 MED ORDER — AMOXICILLIN-POT CLAVULANATE 875-125 MG PO TABS: 1.0000 | ORAL_TABLET | Freq: Two times a day (BID) | ORAL | 0 refills | Status: DC

## 2021-05-28 NOTE — Telephone Encounter (Signed)
[facility] Night - Client TELEPHONE ADVICE RECORD [facility] Patient Name: April Horn Gender: Female DOB: 07/18/1981 Age: 40 Y 9 M 3 D Return Phone Number: [phone] (Primary) Address: City/ State/ Zip: [place] [address] Client [facility] Night - Client Client Site [facility] [place] - Night Physician Gweneth Dimitri- MD Contact Type Call Who Is Calling Patient / Member / Family / Caregiver Call Type Triage / Clinical Relationship To Patient Self Return Phone Number [phone] (Primary) Chief Complaint Unclassified Symptom Reason for Call Symptomatic / Request for Health Information Initial Comment Caller states had tele a health visit today, medicine was prescribe but it wasn't sent to the Pharmacy. Translation No Nurse Assessment Nurse: Fayrene Fearing, RN, Judeth Cornfield Date/Time Lamount Cohen Time): 05/27/2021 6:43:50 PM Confirm and document reason for call. If symptomatic, describe symptoms. ---Caller states had a virtual visit for bacterial sinus infection today. Script not sent over to pharmacy. Symptoms are the same as this morning "maybe a little worse". Does the patient have any new or worsening symptoms? ---Yes Will a triage be completed? ---Yes Related visit to physician within the last 2 weeks? ---Yes Does the PT have any chronic conditions? (i.e. diabetes, asthma, this includes High risk factors for pregnancy, etc.) ---No Is the patient pregnant or possibly pregnant? (Ask all females between the ages of 59-55) ---No Is this a behavioral health or substance abuse call? ---No Guidelines Guideline Title Affirmed Question Affirmed Notes Nurse Date/Time Lamount Cohen Time) Recent Medical Visit for Illness Follow-up Call [1] Caller has NONURGENT question (includes prescribed medication questions) AND [2] triager unable to answer Olevia Perches 05/27/2021 6:48:26 PM PLEASE NOTE: All timestamps  contained within this report are represented as [place] Standard Time. CONFIDENTIALTY NOTICE: This fax transmission is intended only for the addressee. It contains information that is legally privileged, confidential or otherwise protected from use or disclosure. If you are not the intended recipient, you are strictly prohibited from reviewing, disclosing, copying using or disseminating any of this information or taking any action in reliance on or regarding this information. If you have received this fax in error, please notify us immediately by telephone so that we can arrange for its return to [place]. Phone: [phone], Toll-Free: [phone], Fax: [phone] Page: 2 of 2 Call Id: [id number] Disp. Time Lamount Cohen Time) Disposition Final User 05/27/2021 6:34:39 PM Send to Clinical Crissie Figures, RN, Clerance Lav 05/27/2021 6:54:29 PM Call PCP within 24 Hours Yes Fayrene Fearing, RN, Judeth Cornfield Caller Disagree/Comply Comply Caller Understands Yes PreDisposition Call Doctor Care Advice Given Per Guideline CALL PCP WITHIN 24 HOURS: * You need to discuss this with your doctor (or NP/PA) within the next 24 hours. * IF OFFICE WILL BE OPEN: Call the office when it opens tomorrow morning. CALL BACK IF: * You become worse CARE ADVICE given per Recent Medical Visit for Illness: Follow-Up Call (Adult) guideline. Comments User: Kassadee Cockayne, RN Date/Time Lamount Cohen Time): 05/27/2021 6:54:56 PM Pt to call office in morning for script Referrals REFERRED TO PCP OFFIC

## 2021-05-28 NOTE — Telephone Encounter (Signed)
I spoke with pt and she just called [facility] tele[facility] and pt said she has already resolved the issue. Nothing further needed at this time.

## 2021-05-28 NOTE — Progress Notes (Signed)
Augmentin sent in.  

## 2021-12-05 ENCOUNTER — Other Ambulatory Visit: Payer: Self-pay

## 2021-12-05 ENCOUNTER — Ambulatory Visit
Admission: RE | Admit: 2021-12-05 | Discharge: 2021-12-05 | Disposition: A | Discharge status: Home / Self Care | Payer: Commercial Managed Care - PPO | Source: Ambulatory Visit | Attending: Emergency Medicine | Admitting: Emergency Medicine

## 2021-12-05 VITALS — BP 125/95 | HR 87 | Temp 97.9°F | Resp 18

## 2021-12-05 DIAGNOSIS — M436 Torticollis: Secondary | ICD-10-CM | POA: Diagnosis not present

## 2021-12-05 DIAGNOSIS — M62838 Other muscle spasm: Secondary | ICD-10-CM | POA: Diagnosis not present

## 2021-12-05 MED ORDER — CYCLOBENZAPRINE HCL 10 MG PO TABS: 10.0000 mg | ORAL_TABLET | Freq: Two times a day (BID) | ORAL | 0 refills | Status: DC | PRN

## 2021-12-05 NOTE — ED Triage Notes (Addendum)
Neck pain started Wednesday.  Left side of neck pain. Patient participates in a boot camp, performs weighted squats.  Not sure, but thinks this is the cause of pain.  Patient has been taking ibuprofen.  Denies numbness or tingling.  Has spasms.  Has used icy hot gels and applied heat

## 2021-12-05 NOTE — Discharge Instructions (Addendum)
Take the muscle relaxer as needed for muscle spasm; Do not drive, operate machinery, or drink alcohol with this medication as it can cause drowsiness.   Follow up with your primary care provider or an orthopedist if your symptoms are not improving.     

## 2021-12-05 NOTE — ED Provider Notes (Signed)
[facility]    CSN: [id number] Arrival date & time: 12/05/21  [id number]      History   Chief Complaint Chief Complaint  Patient presents with   Torticollis    HPI April Horn is a 41 y.o. female.  Patient presents with left trapezius pain and muscle spasm x3 days.  Her pain started after she was working out, doing squats with a weight bar across her neck and shoulders.  She feels that she strained a muscle.  No falls or specific injury.  Treatment at home with ibuprofen.  She denies numbness, weakness, paresthesias, wounds, bruising, redness, or other symptoms.  She had a telemedicine visit and was prescribed diclofenac which she has not gotten from the pharmacy yet but plans to go get.  LMP: 11/26/2021.  She denies current pregnancy or breastfeeding.     The history is provided by the patient.   Past Medical History:  Diagnosis Date   Medical history non-contributory     Patient Active Problem List   Diagnosis Date Noted   Gestational hypertension 07/14/2017    Past Surgical History:  Procedure Laterality Date   bone growth Left    knee  20 years   CHOLECYSTECTOMY      OB History     Gravida  3   Para  3   Term  3   Preterm      AB      Living  3      SAB      IAB      Ectopic      Multiple  0   Live Births  3            Home Medications    Prior to Admission medications   Medication Sig Start Date End Date Taking? Authorizing Provider  cyclobenzaprine (FLEXERIL) 10 MG tablet Take 1 tablet (10 mg total) by mouth 2 (two) times daily as needed for muscle spasms. 12/05/21  Yes Mickie Bailate, Amiaya Mcneeley H, NP  amoxicillin-clavulanate (AUGMENTIN) 875-125 MG tablet Take 1 tablet by mouth 2 (two) times daily. Patient not taking: Reported on 12/05/2021 05/28/21   Margaretann LovelessBurnette, Jennifer M, PA-C  Multiple Vitamin (MULTIVITAMIN ADULT PO) Take by mouth. Patient not taking: Reported on 12/05/2021    [provider]  Prenatal Vit-Fe Fumarate-FA  (PRENATAL MULTIVITAMIN) TABS tablet Take 1 tablet by mouth daily at 12 noon. Patient not taking: Reported on 04/14/2020    [provider]    Family History Family History  Problem Relation Age of Onset   Arthritis Mother    Depression Mother    Early death Mother    Arthritis Father    Depression Father    Hearing loss Father    Hypertension Father    Hypertension Brother    Hyperlipidemia Brother    Cancer Maternal Grandmother    Stroke Maternal Grandmother    Stroke Maternal Grandfather    Cancer Paternal Grandmother    Heart disease Paternal Grandmother     Social History Social History   Tobacco Use   Smoking status: Never   Smokeless tobacco: Never  Vaping Use   Vaping Use: Never used  Substance Use Topics   Alcohol use: Yes   Drug use: No     Allergies   Patient has no known allergies.   Review of Systems Review of Systems  Respiratory:  Negative for cough and shortness of breath.   Musculoskeletal:  Positive for myalgias and neck stiffness. Negative  for arthralgias, back pain, gait problem, joint swelling and neck pain.  Skin:  Negative for color change, rash and wound.  Neurological:  Negative for weakness and numbness.  All other systems reviewed and are negative.   Physical Exam Triage Vital Signs ED Triage Vitals  Enc Vitals Group     BP      Pulse      Resp      Temp      Temp src      SpO2      Weight      Height      Head Circumference      Peak Flow      Pain Score      Pain Loc      Pain Edu?      Excl. in GC?    No data found.  Updated Vital Signs BP (!) 125/95   Pulse 87   Temp 97.9 F (36.6 C)   Resp 18   LMP 11/26/2021   SpO2 99%   Visual Acuity Right Eye Distance:   Left Eye Distance:   Bilateral Distance:    Right Eye Near:   Left Eye Near:    Bilateral Near:     Physical Exam Vitals and nursing note reviewed.  Constitutional:      General: She is not in acute distress.    Appearance: Normal  appearance. She is well-developed. She is not ill-appearing.  HENT:     Mouth/Throat:     Mouth: Mucous membranes are moist.  Cardiovascular:     Rate and Rhythm: Normal rate and regular rhythm.     Heart sounds: Normal heart sounds.  Pulmonary:     Effort: Pulmonary effort is normal. No respiratory distress.     Breath sounds: Normal breath sounds.  Musculoskeletal:        General: Tenderness present. No swelling, deformity or signs of injury.     Cervical back: Neck supple.     Comments: Left trapezius mildly tender to palpation.  No lesions, erythema, ecchymosis. Limited ROM of neck due to discomfort.  LUE: FROM, strength 5/5, sensation intact, 2+ pulses.   Skin:    General: Skin is warm and dry.     Capillary Refill: Capillary refill takes less than 2 seconds.     Findings: No bruising, erythema, lesion or rash.  Neurological:     General: No focal deficit present.     Mental Status: She is alert and oriented to person, place, and time.     Sensory: No sensory deficit.     Motor: No weakness.  Psychiatric:        Mood and Affect: Mood normal.        Behavior: Behavior normal.     UC Treatments / Results  Labs (all labs ordered are listed, but only abnormal results are displayed) Labs Reviewed - No data to display  EKG   Radiology No results found.  Procedures Procedures (including critical care time)  Medications Ordered in UC Medications - No data to display  Initial Impression / Assessment and Plan / UC Course  I have reviewed the triage vital signs and the nursing notes.  Pertinent labs & imaging results that were available during my care of the patient were reviewed by me and considered in my medical decision making (see chart for details).    Muscle spasm, torticollis.  Discussed with patient that she can take the diclofenac as needed for pain as prescribed  via telemedicine.  Treating muscle spasm with Flexeril.  Precautions for drowsiness with muscle  relaxer discussed.  Education provided on torticollis.  Instructed patient to follow-up with her PCP or an orthopedist if her symptoms are not improving.  She agrees to plan of care.  Final Clinical Impressions(s) / UC Diagnoses   Final diagnoses:  Muscle spasm  Torticollis     Discharge Instructions      Take the muscle relaxer as needed for muscle spasm; Do not drive, operate machinery, or drink alcohol with this medication as it can cause drowsiness.   Follow up with your primary care provider or an orthopedist if your symptoms are not improving.         ED Prescriptions     Medication Sig Dispense Auth. Provider   cyclobenzaprine (FLEXERIL) 10 MG tablet Take 1 tablet (10 mg total) by mouth 2 (two) times daily as needed for muscle spasms. 20 tablet Mickie Bail, NP      PDMP not reviewed this encounter.   Mickie Bail, NP 12/05/21 [id number]

## 2022-05-21 ENCOUNTER — Ambulatory Visit: Payer: Commercial Managed Care - PPO | Admitting: Family

## 2022-08-04 ENCOUNTER — Encounter: Payer: Self-pay | Admitting: Family

## 2022-08-05 ENCOUNTER — Encounter: Payer: Self-pay | Admitting: Family

## 2022-08-05 ENCOUNTER — Ambulatory Visit: Payer: Commercial Managed Care - PPO | Admitting: Family

## 2022-08-05 VITALS — BP 128/82 | HR 96 | Temp 98.1°F | Ht 64.25 in | Wt 183.5 lb

## 2022-08-05 DIAGNOSIS — E661 Drug-induced obesity: Secondary | ICD-10-CM | POA: Diagnosis not present

## 2022-08-05 DIAGNOSIS — Z0000 Encounter for general adult medical examination without abnormal findings: Secondary | ICD-10-CM | POA: Insufficient documentation

## 2022-08-05 DIAGNOSIS — Z13220 Encounter for screening for lipoid disorders: Secondary | ICD-10-CM

## 2022-08-05 DIAGNOSIS — E559 Vitamin D deficiency, unspecified: Secondary | ICD-10-CM | POA: Diagnosis not present

## 2022-08-05 DIAGNOSIS — Z6831 Body mass index (BMI) 31.0-31.9, adult: Secondary | ICD-10-CM

## 2022-08-05 LAB — BASIC METABOLIC PANEL
BUN: 15 mg/dL (ref 6–23)
CO2: 28 mEq/L (ref 19–32)
Calcium: 10.1 mg/dL (ref 8.4–10.5)
Chloride: 102 mEq/L (ref 96–112)
Creatinine, Ser: 0.75 mg/dL (ref 0.40–1.20)
GFR: 98.52 mL/min (ref >60.00)
Glucose, Bld: 105 mg/dL — ABNORMAL HIGH (ref 70–99)
Potassium: 4.2 mEq/L (ref 3.5–5.1)
Sodium: 138 mEq/L (ref 135–145)

## 2022-08-05 LAB — CBC
HCT: 41.6 % (ref 36.0–46.0)
Hemoglobin: 14.5 g/dL (ref 12.0–15.0)
MCHC: 34.8 g/dL (ref 30.0–36.0)
MCV: 85.2 fl (ref 78.0–100.0)
Platelets: 263 10*3/uL (ref 150.0–400.0)
RBC: 4.88 Mil/uL (ref 3.87–5.11)
RDW: 13 % (ref 11.5–15.5)
WBC: 5.6 10*3/uL (ref 4.0–10.5)

## 2022-08-05 LAB — LIPID PANEL
Cholesterol: 170 mg/dL (ref 0–200)
HDL: 52.5 mg/dL (ref >39.00)
LDL Cholesterol: 95 mg/dL (ref 0–99)
NonHDL: 117.88
Total CHOL/HDL Ratio: 3
Triglycerides: 113 mg/dL (ref 0.0–149.0)
VLDL: 22.6 mg/dL (ref 0.0–40.0)

## 2022-08-05 NOTE — Assessment & Plan Note (Signed)
Pt advised to work on diet and exercise as tolerated  

## 2022-08-05 NOTE — Progress Notes (Signed)
New Patient Office Visit  Subjective:  Patient ID: April Horn, female    DOB: 1980/08/26  Age: 42 y.o. MRN: [id number]  CC:  Chief Complaint  Patient presents with   New Patient (Initial Visit)    Pt is fasting if lab work is needed.     HPI April Horn is here to have a transfer of care visit as well as for an annual physical exam.   Prior provider was: Tor Netters, FNP  Pt is without acute concerns.  [facility] in [place]   Pap goes yearly with gyn, last 03/21/2015 due this year  Mammogram: ordered, has to schedule.  Flu vaccine:  chronic concerns:  Sleep disorder: ashwagandha helps her calm down takes nightly   Past Medical History:  Diagnosis Date   Medical history non-contributory     Past Surgical History:  Procedure Laterality Date   bone growth Left    knee  41 years   CHOLECYSTECTOMY      Family History  Problem Relation Age of Onset   Arthritis Mother    Depression Mother    Early death Mother        sepsis , age 35   Obesity Mother    Arthritis Father    Depression Father    Hearing loss Father    Hypertension Father    Other Father        repeat DVTs   Hypertension Brother    Hyperlipidemia Brother    Diabetes Brother    Mental illness Brother    Breast cancer Maternal Grandmother        54's and 20's   Stroke Maternal Grandmother    Stroke Maternal Grandfather    Uterine cancer Paternal Grandmother        met to stomach and brai   Heart disease Paternal Grandmother     Social History   Socioeconomic History   Marital status: Married    Spouse name: Not on file   Number of children: 3   Years of education: Not on file   Highest education level: Not on file  Occupational History   Occupation: speech therapist    Employer: [employer]  Tobacco Use   Smoking status: Never   Smokeless tobacco: Never  Vaping Use   Vaping Use: Never used  Substance and Sexual Activity   Alcohol use: Yes   Drug use: No   Sexual  activity: Yes    Partners: Male    Birth control/protection: None  Other Topics Concern   Not on file  Social History Narrative   Three children 12, 56, 5    Social Determinants of Radio broadcast assistant Strain: Not on file  Food Insecurity: Not on file  Transportation Needs: Not on file  Physical Activity: Not on file  Stress: Not on file  Social Connections: Not on file  Intimate Partner Violence: Not on file    Outpatient Medications Prior to Visit  Medication Sig Dispense Refill   Ashwagandha 500 MG CAPS Take 1 capsule by mouth at bedtime.     CALCIUM-MAGNESIUM-ZINC PO Take by mouth daily.     Cholecalciferol (VITAMIN D3) 50 MCG (2000 UT) TABS Take 1 tablet by mouth daily.     Multiple Vitamin (MULTIVITAMIN ADULT PO) Take by mouth.     amoxicillin-clavulanate (AUGMENTIN) 875-125 MG tablet Take 1 tablet by mouth 2 (two) times daily. (Patient not taking: Reported on 12/05/2021) 20 tablet 0   cyclobenzaprine (FLEXERIL)  10 MG tablet Take 1 tablet (10 mg total) by mouth 2 (two) times daily as needed for muscle spasms. 20 tablet 0   Prenatal Vit-Fe Fumarate-FA (PRENATAL MULTIVITAMIN) TABS tablet Take 1 tablet by mouth daily at 12 noon. (Patient not taking: Reported on 04/14/2020)     No facility-administered medications prior to visit.    No Known Allergies  ROS Review of Systems  Systems negative unless otherwise addressed in HPI    Objective:    Physical Exam Vitals reviewed.  Constitutional:      General: She is not in acute distress.    Appearance: Normal appearance. She is obese. She is not ill-appearing or toxic-appearing.  HENT:     Right Ear: Tympanic membrane normal.     Left Ear: Tympanic membrane normal.     Mouth/Throat:     Mouth: Mucous membranes are moist.     Pharynx: No pharyngeal swelling.     Tonsils: No tonsillar exudate.  Eyes:     Extraocular Movements: Extraocular movements intact.     Conjunctiva/sclera: Conjunctivae normal.      Pupils: Pupils are equal, round, and reactive to light.  Neck:     Thyroid: No thyroid mass.  Cardiovascular:     Rate and Rhythm: Normal rate and regular rhythm.  Pulmonary:     Effort: Pulmonary effort is normal.     Breath sounds: Normal breath sounds.  Abdominal:     General: Abdomen is flat. Bowel sounds are normal.     Palpations: Abdomen is soft.  Musculoskeletal:        General: Normal range of motion.  Lymphadenopathy:     Cervical:     Right cervical: No superficial cervical adenopathy.    Left cervical: No superficial cervical adenopathy.  Skin:    General: Skin is warm.     Capillary Refill: Capillary refill takes less than 2 seconds.  Neurological:     General: No focal deficit present.     Mental Status: She is alert and oriented to person, place, and time.  Psychiatric:        Mood and Affect: Mood normal.        Behavior: Behavior normal.        Thought Content: Thought content normal.        Judgment: Judgment normal.       BP 128/82   Pulse 96   Temp 98.1 F (36.7 C) (Temporal)   Ht 5' 4.25" (1.632 m)   Wt 183 lb 8 oz (83.2 kg)   LMP 07/16/2022   SpO2 98%   BMI 31.25 kg/m  Wt Readings from Last 3 Encounters:  08/05/22 183 lb 8 oz (83.2 kg)  04/14/20 167 lb (75.8 kg)  07/14/17 195 lb (88.5 kg)     Health Maintenance Due  Topic Date Due   PAP SMEAR-Modifier  03/20/2018    There are no preventive care reminders to display for this patient.  Lab Results  Component Value Date   TSH 0.98 04/14/2020   Lab Results  Component Value Date   WBC 7.8 04/14/2020   HGB 13.9 04/14/2020   HCT 39.6 04/14/2020   MCV 85.8 04/14/2020   PLT 293.0 04/14/2020   Lab Results  Component Value Date   NA 137 04/14/2020   K 4.1 04/14/2020   CO2 29 04/14/2020   GLUCOSE 84 04/14/2020   BUN 16 04/14/2020   CREATININE 0.71 04/14/2020   BILITOT 0.6 04/14/2020   ALKPHOS 46 04/14/2020  AST 31 04/14/2020   ALT 48 (H) 04/14/2020   PROT 7.8 04/14/2020    ALBUMIN 5.1 04/14/2020   CALCIUM 10.2 04/14/2020   ANIONGAP 9 07/14/2017   GFR 91.34 04/14/2020   Lab Results  Component Value Date   CHOL 163 04/14/2020   Lab Results  Component Value Date   HDL 52.40 04/14/2020   Lab Results  Component Value Date   LDLCALC 95 04/14/2020   Lab Results  Component Value Date   TRIG 76.0 04/14/2020   Lab Results  Component Value Date   CHOLHDL 3 04/14/2020   No results found for: "HGBA1C"    Assessment & Plan:    Encounter for general adult medical examination without abnormal findings Assessment & Plan: Patient Counseling(The following topics were reviewed):  Preventative care handout given to pt  Health maintenance and immunizations reviewed. Please refer to Health maintenance section. Pt advised on safe sex, wearing seatbelts in car, and proper nutrition labwork ordered today for annual Dental health: Discussed importance of regular tooth brushing, flossing, and dental visits.   Orders: -     CBC -     Basic metabolic panel -     Lipid panel  Vitamin D deficiency Assessment & Plan: Ordered vitamin d pending results.     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   Follow-up: Return in about 1 year (around 08/06/2023) for f/u CPE.    Eugenia Pancoast, FNP

## 2022-08-05 NOTE — Patient Instructions (Signed)
  Stop by the lab prior to leaving today. I will notify you of your results once received.   Recommendations on keeping yourself healthy:  - Exercise at least 30-45 minutes a day, 3-4 days a week.  - Eat a low-fat diet with lots of fruits and vegetables, up to 7-9 servings per day.  - Seatbelts can save your life. Wear them always.  - Smoke detectors on every level of your home, check batteries every year.  - Eye Doctor - have an eye exam every 1-2 years  - Safe sex - if you may be exposed to STDs, use a condom.  - Alcohol -  If you drink, do it moderately, less than 2 drinks per day.  - Health Care Power of Attorney. Choose someone to speak for you if you are not able.  - Depression is common in our stressful world.If you're feeling down or losing interest in things you normally enjoy, please come in for a visit.  - Violence - If anyone is threatening or hurting you, please call immediately.  Due to recent changes in healthcare laws, you may see results of your imaging and/or laboratory studies on MyChart before I have had a chance to review them.  I understand that in some cases there may be results that are confusing or concerning to you. Please understand that not all results are received at the same time and often I may need to interpret multiple results in order to provide you with the best plan of care or course of treatment. Therefore, I ask that you please give me 2 business days to thoroughly review all your results before contacting my office for clarification. Should we see a critical lab result, you will be contacted sooner.   I will see you again in one year for your annual comprehensive exam unless otherwise stated and or with acute concerns.  It was a pleasure seeing you today! Please do not hesitate to reach out with any questions and or concerns.  Regards,   Melia Hopes    

## 2022-08-05 NOTE — Assessment & Plan Note (Signed)
Patient Counseling(The following topics were reviewed): ? Preventative care handout given to pt  ?Health maintenance and immunizations reviewed. Please refer to Health maintenance section. ?Pt advised on safe sex, wearing seatbelts in car, and proper nutrition ?labwork ordered today for annual ?Dental health: Discussed importance of regular tooth brushing, flossing, and dental visits. ? ? ?

## 2022-08-05 NOTE — Assessment & Plan Note (Signed)
Ordered vitamin d pending results.   

## 2022-12-10 ENCOUNTER — Encounter: Payer: Self-pay | Admitting: Family

## 2023-06-08 ENCOUNTER — Encounter: Payer: Self-pay | Admitting: Family

## 2023-08-10 ENCOUNTER — Encounter: Payer: Commercial Managed Care - PPO | Admitting: Family

## 2023-09-21 ENCOUNTER — Other Ambulatory Visit: Payer: Self-pay | Admitting: Obstetrics and Gynecology

## 2023-09-21 DIAGNOSIS — R928 Other abnormal and inconclusive findings on diagnostic imaging of breast: Secondary | ICD-10-CM

## 2023-10-05 ENCOUNTER — Ambulatory Visit
Admission: RE | Admit: 2023-10-05 | Discharge: 2023-10-05 | Disposition: A | Discharge status: Home / Self Care | Source: Ambulatory Visit | Attending: Obstetrics and Gynecology | Admitting: Obstetrics and Gynecology

## 2023-10-05 ENCOUNTER — Encounter: Payer: Commercial Managed Care - PPO | Admitting: Family

## 2023-10-05 DIAGNOSIS — R928 Other abnormal and inconclusive findings on diagnostic imaging of breast: Secondary | ICD-10-CM

## 2023-11-18 ENCOUNTER — Ambulatory Visit (INDEPENDENT_AMBULATORY_CARE_PROVIDER_SITE_OTHER): Payer: Commercial Managed Care - PPO | Admitting: Family

## 2023-11-18 VITALS — BP 110/76 | HR 105 | Temp 98.6°F | Ht 64.25 in | Wt 197.6 lb

## 2023-11-18 DIAGNOSIS — E559 Vitamin D deficiency, unspecified: Secondary | ICD-10-CM | POA: Diagnosis not present

## 2023-11-18 DIAGNOSIS — E66811 Obesity, class 1: Secondary | ICD-10-CM | POA: Diagnosis not present

## 2023-11-18 DIAGNOSIS — M7711 Lateral epicondylitis, right elbow: Secondary | ICD-10-CM

## 2023-11-18 DIAGNOSIS — Z13220 Encounter for screening for lipoid disorders: Secondary | ICD-10-CM | POA: Diagnosis not present

## 2023-11-18 DIAGNOSIS — Z0000 Encounter for general adult medical examination without abnormal findings: Secondary | ICD-10-CM | POA: Diagnosis not present

## 2023-11-18 DIAGNOSIS — Z6831 Body mass index (BMI) 31.0-31.9, adult: Secondary | ICD-10-CM

## 2023-11-18 DIAGNOSIS — R739 Hyperglycemia, unspecified: Secondary | ICD-10-CM | POA: Diagnosis not present

## 2023-11-18 DIAGNOSIS — N920 Excessive and frequent menstruation with regular cycle: Secondary | ICD-10-CM | POA: Diagnosis not present

## 2023-11-18 DIAGNOSIS — E661 Drug-induced obesity: Secondary | ICD-10-CM | POA: Diagnosis not present

## 2023-11-18 LAB — BASIC METABOLIC PANEL WITH GFR
BUN: 11 mg/dL (ref 6–23)
CO2: 27 meq/L (ref 19–32)
Calcium: 9.5 mg/dL (ref 8.4–10.5)
Chloride: 103 meq/L (ref 96–112)
Creatinine, Ser: 0.69 mg/dL (ref 0.40–1.20)
GFR: 106.43 mL/min (ref >60.00)
Glucose, Bld: 95 mg/dL (ref 70–99)
Potassium: 4.3 meq/L (ref 3.5–5.1)
Sodium: 137 meq/L (ref 135–145)

## 2023-11-18 LAB — LIPID PANEL
Cholesterol: 168 mg/dL (ref 0–200)
HDL: 45 mg/dL (ref >39.00)
LDL Cholesterol: 99 mg/dL (ref 0–99)
NonHDL: 122.55
Total CHOL/HDL Ratio: 4
Triglycerides: 117 mg/dL (ref 0.0–149.0)
VLDL: 23.4 mg/dL (ref 0.0–40.0)

## 2023-11-18 LAB — VITAMIN D 25 HYDROXY (VIT D DEFICIENCY, FRACTURES): VITD: 17.43 ng/mL — ABNORMAL LOW (ref 30.00–100.00)

## 2023-11-18 LAB — TSH: TSH: 1.1 u[IU]/mL (ref 0.35–5.50)

## 2023-11-18 LAB — HEMOGLOBIN A1C: Hgb A1c MFr Bld: 5.3 % (ref 4.6–6.5)

## 2023-11-18 MED ORDER — PHENTERMINE HCL 37.5 MG PO CAPS: ORAL_CAPSULE | ORAL | 0 refills | Status: DC

## 2023-11-18 MED ORDER — PHENTERMINE HCL 15 MG PO CAPS: ORAL_CAPSULE | ORAL | 0 refills | Status: DC

## 2023-11-18 NOTE — Assessment & Plan Note (Signed)
 Ice/heat  Ibuprofen /tylenol  Rest.  Brace for tennis elbow See Dr. Geralyn Knee if no improvement

## 2023-11-18 NOTE — Assessment & Plan Note (Signed)
 Pt advised of the following: Work on a diabetic diet, try to incorporate exercise at least 20-30 a day for 3 days a week or more.

## 2023-11-18 NOTE — Assessment & Plan Note (Addendum)
 Referral to healthy weight and wellness.  Pt advised to work on diet and exercise as tolerated The beneficiary does not have any FDA labeled contraindications to the requested agent including pregnancy, lactation, h/o medullary thyroid cancer or multiple endocrine neoplasia type II.  Wlil hold on GLP 1 for now.  Trial phentermine discussed s/e potential  Will only do for 3 month period.  Pdmp reviewed.  Baseline, no heart abn or symptoms.  F/u two months  Referral to weight loss and wellness

## 2023-11-18 NOTE — Patient Instructions (Addendum)
  Ask about possible coverage for the following:   If not diabetic:  Wegovy  Zepbound  A referral was placed today for [place] [facility]. Please let us  know if you have not heard back within 2 weeks about the referral.  Look into season app as well for nutritionist.  Jessalyn anderson , the one that I used.

## 2023-11-18 NOTE — Assessment & Plan Note (Signed)
 Improving with IUD insertion  Cont f/u with gynecology as necessary

## 2023-11-18 NOTE — Assessment & Plan Note (Signed)
 Ordered vitamin d pending results.

## 2023-11-18 NOTE — Progress Notes (Signed)
 Subjective:  Patient ID: April Horn, female    DOB: [birth year]  Age: 43 y.o. MRN: [id number]  Patient Care Team: [name], FNP as PCP - General (Family Medicine)   CC:  Chief Complaint  Patient presents with  . Annual Exam    HPI April Horn is a 43 y.o. female who presents today for an annual physical exam. She reports consuming a general diet.she does state often having to cook things quickly at home which at times will not be the healthiest.  For activity she was going to gym until she started having tennis elbow symptoms.  She generally feels well. She reports sleeping fairly well. She does have additional problems to discuss today.   Vision:Not within last year Dental:Receives regular dental care  Mammogram: 10/05/23 Last pap: 09/15/23 Has IUD in place.   Pt is with acute concerns.  She doesn't know necessarily what she needs to eat in order to lose weight. She is on the go meal prepping at home and sometimes not the best options. She was working on exercise but tennis elbow has inhibited that currently. He would like to try phentermine  Right elbow pain, at times lifting heavy items aggravates it. She can't exercise with weights right now because it will aggravate the pain as well.    Advanced Directives Patient does have advanced directives. She does not have a copy in the electronic medical record.   DEPRESSION SCREENING    11/18/2023    9:09 AM 08/05/2022    8:09 AM 04/14/2020    3:02 PM 04/06/2015    9:19 AM  PHQ 2/9 Scores  PHQ - 2 Score 0 0 0 0  PHQ- 9 Score 2 0       ROS: Negative unless specifically indicated above in HPI.    Current Outpatient Medications:  .  Ashwagandha 500 MG CAPS, Take 1 capsule by mouth at bedtime., Disp: , Rfl:  .  CALCIUM-MAGNESIUM-ZINC PO, Take by mouth daily., Disp: , Rfl:  .  Cholecalciferol (VITAMIN D3) 50 MCG (2000 UT) TABS, Take 1 tablet by mouth daily., Disp: , Rfl:  .  Multiple Vitamin (MULTIVITAMIN ADULT  PO), Take by mouth., Disp: , Rfl:  .  phentermine 15 MG capsule, Take one po qd for one week, then start taking new RX of 37.5 mg once daily therafter, Disp: 7 capsule, Rfl: 0 .  phentermine 37.5 MG capsule, Take one po qd at week two after completing one week of 15 mg once daily., Disp: 21 capsule, Rfl: 0    Objective:    BP 110/76 (BP Location: Left Arm, Patient Position: Sitting, Cuff Size: Normal)   Pulse (!) 105   Temp 98.6 F (37 C) (Temporal)   Ht 5' 4.25" (1.632 m)   Wt 197 lb 9.6 oz (89.6 kg)   SpO2 99%   BMI 33.65 kg/m   BP Readings from Last 3 Encounters:  11/18/23 110/76  08/05/22 128/82  12/05/21 (!) 125/95      Physical Exam Constitutional:      General: She is not in acute distress.    Appearance: Normal appearance. She is obese. She is not ill-appearing.  HENT:     Head: Normocephalic.     Right Ear: Tympanic membrane normal.     Left Ear: Tympanic membrane normal.     Nose: Nose normal.     Mouth/Throat:     Mouth: Mucous membranes are moist.  Eyes:     Extraocular Movements:  Extraocular movements intact.     Pupils: Pupils are equal, round, and reactive to light.  Cardiovascular:     Rate and Rhythm: Normal rate and regular rhythm.  Pulmonary:     Effort: Pulmonary effort is normal.     Breath sounds: Normal breath sounds.  Abdominal:     General: Abdomen is flat. Bowel sounds are normal.     Palpations: Abdomen is soft.     Tenderness: There is no guarding or rebound.  Musculoskeletal:        General: Normal range of motion.     Cervical back: Normal range of motion.  Skin:    General: Skin is warm.     Capillary Refill: Capillary refill takes less than 2 seconds.  Neurological:     General: No focal deficit present.     Mental Status: She is alert.  Psychiatric:        Mood and Affect: Mood normal.        Behavior: Behavior normal.        Thought Content: Thought content normal.        Judgment: Judgment normal.         Assessment  & Plan:  Encounter for general adult medical examination without abnormal findings Assessment & Plan: Patient Counseling(The following topics were reviewed):  Preventative care handout given to pt  Health maintenance and immunizations reviewed. Please refer to Health maintenance section. Pt advised on safe sex, wearing seatbelts in car, and proper nutrition labwork ordered today for annual Dental health: Discussed importance of regular tooth brushing, flossing, and dental visits.   Orders: -     TSH  Menorrhagia with regular cycle Assessment & Plan: Improving with IUD insertion  Cont f/u with gynecology as necessary   Vitamin D  deficiency Assessment & Plan: Ordered vitamin d  pending results.    Orders: -     VITAMIN D  25 Hydroxy (Vit-D Deficiency, Fractures)  Hyperglycemia Assessment & Plan: Pt advised of the following: Work on a diabetic diet, try to incorporate exercise at least 20-30 a day for 3 days a week or more.    Orders: -     Basic metabolic panel with GFR -     Hemoglobin A1c -     Amb Ref to Medical Weight Management -     Phentermine HCl; Take one po qd at week two after completing one week of 15 mg once daily.  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  Pt advised to work on diet and exercise as tolerated The beneficiary does not have any FDA labeled contraindications to the requested agent including pregnancy, lactation, h/o medullary thyroid cancer or multiple endocrine neoplasia type II.  Wlil hold on GLP 1 for now.  Trial phentermine discussed s/e potential  Will only do for 3 month period.  Pdmp reviewed.  Baseline, no heart abn or symptoms.  F/u two  months  Referral to weight loss and wellness   Orders: -     Lipid panel -     Basic metabolic panel with GFR -     CBC -     Hemoglobin A1c -  Amb Ref to Medical Weight Management -     TSH -     Phentermine HCl; Take one po qd at week two after completing one week of 15 mg once daily.  Dispense: 21 capsule; Refill: 0 -     Phentermine HCl; Take one po qd for one week, then start taking new RX of 37.5 mg once daily therafter  Dispense: 7 capsule; Refill: 0  Screening for lipoid disorders -     Lipid panel  Right tennis elbow Assessment & Plan: Ice/heat  Ibuprofen /tylenol  Rest.  Brace for tennis elbow See Dr. Geralyn Knee if no improvement        Follow-up: Return in about 1 year (around 11/17/2024) for f/u CPE.   [name], FNP

## 2023-11-18 NOTE — Assessment & Plan Note (Signed)

## 2023-11-19 LAB — CBC
HCT: 41.4 % (ref 36.0–46.0)
Hemoglobin: 14.1 g/dL (ref 12.0–15.0)
MCHC: 34.1 g/dL (ref 30.0–36.0)
MCV: 86.9 fl (ref 78.0–100.0)
Platelets: 274 10*3/uL (ref 150.0–400.0)
RBC: 4.77 Mil/uL (ref 3.87–5.11)
RDW: 13 % (ref 11.5–15.5)
WBC: 5.8 10*3/uL (ref 4.0–10.5)

## 2023-11-21 ENCOUNTER — Other Ambulatory Visit: Payer: Self-pay | Admitting: Family

## 2023-11-21 ENCOUNTER — Encounter (INDEPENDENT_AMBULATORY_CARE_PROVIDER_SITE_OTHER): Payer: Self-pay

## 2023-11-21 ENCOUNTER — Encounter: Payer: Self-pay | Admitting: Family

## 2023-11-21 DIAGNOSIS — E559 Vitamin D deficiency, unspecified: Secondary | ICD-10-CM

## 2023-11-21 MED ORDER — CHOLECALCIFEROL 1.25 MG (50000 UT) PO TABS: 1.0000 | ORAL_TABLET | ORAL | 0 refills | Status: AC

## 2023-12-14 ENCOUNTER — Encounter: Payer: Self-pay | Admitting: Family

## 2023-12-14 DIAGNOSIS — E661 Drug-induced obesity: Secondary | ICD-10-CM

## 2023-12-14 DIAGNOSIS — G479 Sleep disorder, unspecified: Secondary | ICD-10-CM

## 2023-12-14 DIAGNOSIS — R739 Hyperglycemia, unspecified: Secondary | ICD-10-CM

## 2023-12-15 MED ORDER — TRAZODONE HCL 50 MG PO TABS: 25.0000 mg | ORAL_TABLET | Freq: Every evening | ORAL | 3 refills | Status: DC | PRN

## 2023-12-15 NOTE — Telephone Encounter (Signed)
 Can not send phentermine  right now.  Without imprivata at the moment. Fill tomorrow 5/30

## 2023-12-18 ENCOUNTER — Other Ambulatory Visit: Payer: Self-pay | Admitting: Family

## 2023-12-18 DIAGNOSIS — R739 Hyperglycemia, unspecified: Secondary | ICD-10-CM

## 2023-12-18 DIAGNOSIS — E661 Drug-induced obesity: Secondary | ICD-10-CM

## 2023-12-29 ENCOUNTER — Ambulatory Visit: Admitting: Family

## 2023-12-29 ENCOUNTER — Encounter: Payer: Self-pay | Admitting: Family

## 2023-12-29 VITALS — BP 134/86 | HR 84 | Temp 98.3°F | Ht 64.25 in | Wt 184.6 lb

## 2023-12-29 DIAGNOSIS — G479 Sleep disorder, unspecified: Secondary | ICD-10-CM | POA: Diagnosis not present

## 2023-12-29 DIAGNOSIS — E559 Vitamin D deficiency, unspecified: Secondary | ICD-10-CM | POA: Diagnosis not present

## 2023-12-29 DIAGNOSIS — E66811 Obesity, class 1: Secondary | ICD-10-CM | POA: Diagnosis not present

## 2023-12-29 DIAGNOSIS — E661 Drug-induced obesity: Secondary | ICD-10-CM

## 2023-12-29 DIAGNOSIS — Z6831 Body mass index (BMI) 31.0-31.9, adult: Secondary | ICD-10-CM

## 2023-12-29 MED ORDER — PHENTERMINE-TOPIRAMATE ER 7.5-46 MG PO CP24: ORAL_CAPSULE | ORAL | 2 refills | Status: DC

## 2023-12-29 MED ORDER — PHENTERMINE HCL 15 MG PO CAPS
15.0000 mg | ORAL_CAPSULE | ORAL | 0 refills | Status: DC
Start: 2023-12-29 — End: 2024-02-08

## 2023-12-29 MED ORDER — PHENTERMINE-TOPIRAMATE ER 3.75-23 MG PO CP24: ORAL_CAPSULE | ORAL | 0 refills | Status: DC

## 2023-12-29 NOTE — Progress Notes (Signed)
 Established Patient Office Visit  Subjective:      CC:  Chief Complaint  Patient presents with   Medical Management of Chronic Issues    HPI: April Horn is a 43 y.o. female presenting on 12/29/2023 for Medical Management of Chronic Issues   Obesity, doing well on phentermine . She has lost a total of 13 pounds. She does have dry mouth and has a lot of energy. She does think her blood pressure is raised slightly because diastolic is around 85 on the lower end. She does also note that she has noticed that she has to concentrate a little bit more on squinting when she is looking at a computer screen. No chest pain palpitations or sob. Her avrage heart rate ranges from 100-115 regularly if she is at rest her heart rate is in the 90's . Even before starting the phentermine  she had slightly elevated heart rates, last visit before starting her heart rate was 105.   Wt Readings from Last 3 Encounters:  12/29/23 184 lb 9.6 oz (83.7 kg)  11/18/23 197 lb 9.6 oz (89.6 kg)  08/05/22 183 lb 8 oz (83.2 kg)   Wt Readings from Last 3 Encounters:  12/29/23 184 lb 9.6 oz (83.7 kg)  11/18/23 197 lb 9.6 oz (89.6 kg)  08/05/22 183 lb 8 oz (83.2 kg)   Temp Readings from Last 3 Encounters:  12/29/23 98.3 F (36.8 C) (Temporal)  11/18/23 98.6 F (37 C) (Temporal)  08/05/22 98.1 F (36.7 C) (Temporal)   BP Readings from Last 3 Encounters:  12/29/23 134/86  11/18/23 110/76  08/05/22 128/82   Pulse Readings from Last 3 Encounters:  12/29/23 84  11/18/23 (!) 105  08/05/22 96   Sleep, tried trazodone  and just felt pretty rough the next am.  She has tried melatonin every few days and this seems to help.  She has started magnesium and ashwaghanda recently as well.        Social history:  Relevant past medical, surgical, family and social history reviewed and updated as indicated. Interim medical history since our last visit reviewed.  Allergies and medications reviewed and  updated.  DATA REVIEWED: CHART IN EPIC     ROS: Negative unless specifically indicated above in HPI.    Current Outpatient Medications:    Ashwagandha 500 MG CAPS, Take 1 capsule by mouth at bedtime., Disp: , Rfl:    Cholecalciferol  (VITAMIN D3) 50 MCG (2000 UT) TABS, Take 1 tablet by mouth daily., Disp: , Rfl:    Cholecalciferol  1.25 MG (50000 UT) TABS, Take 1 tablet by mouth once a week., Disp: 12 tablet, Rfl: 0   Multiple Vitamin (MULTIVITAMIN ADULT PO), Take by mouth., Disp: , Rfl:    phentermine  15 MG capsule, Take 1 capsule (15 mg total) by mouth every morning., Disp: 30 capsule, Rfl: 0   Phentermine -Topiramate (QSYMIA) 3.75-23 MG CP24, Take one capsule (3.75-23 mg) daily for 14 days, then increase to 7.5/46 mg dosing with new RX, Disp: 14 capsule, Rfl: 0   Phentermine -Topiramate (QSYMIA) 7.5-46 MG CP24, After two weeks of 3.75-23 mg capsules, start taking one capsule (7.5-46 mg) daily., Disp: 30 capsule, Rfl: 2      Objective:    BP 134/86 (BP Location: Right Arm, Patient Position: Sitting, Cuff Size: Normal)   Pulse 84   Temp 98.3 F (36.8 C) (Temporal)   Ht 5' 4.25 (1.632 m)   Wt 184 lb 9.6 oz (83.7 kg)   SpO2 97%   BMI 31.44  kg/m   Wt Readings from Last 3 Encounters:  12/29/23 184 lb 9.6 oz (83.7 kg)  11/18/23 197 lb 9.6 oz (89.6 kg)  08/05/22 183 lb 8 oz (83.2 kg)    Physical Exam Vitals reviewed.  Constitutional:      General: She is not in acute distress.    Appearance: Normal appearance. She is normal weight. She is not ill-appearing, toxic-appearing or diaphoretic.  HENT:     Head: Normocephalic.   Cardiovascular:     Rate and Rhythm: Normal rate and regular rhythm.  Pulmonary:     Effort: Pulmonary effort is normal.   Musculoskeletal:        General: Normal range of motion.   Neurological:     General: No focal deficit present.     Mental Status: She is alert and oriented to person, place, and time. Mental status is at baseline.    Psychiatric:        Mood and Affect: Mood normal.        Behavior: Behavior normal.        Thought Content: Thought content normal.        Judgment: Judgment normal.           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  Pdmp reviewed.  Continue with working on diet and exercise.    Orders: -     Phentermine  HCl; Take 1 capsule (15 mg total) by mouth every morning.  Dispense: 30 capsule; Refill: 0 -     Phentermine -Topiramate; After two weeks of 3.75-23 mg capsules, start taking one capsule (7.5-46 mg) daily.  Dispense: 30 capsule; Refill: 2 -     Phentermine -Topiramate; Take one capsule (3.75-23 mg) daily for 14 days, then increase to 7.5/46 mg dosing with new RX  Dispense: 14 capsule; Refill: 0  Sleep disorder Assessment & Plan: Continue magnesium  Melatonin ok to continue as well.     Vitamin D  deficiency Assessment & Plan: Cont RX vitamin D  then start on otc vitamin D3 2000 I/U once daily       Return in about 6 months (around 06/29/2024).  April Horns, MSN, APRN, FNP-C  [facility]

## 2023-12-29 NOTE — Assessment & Plan Note (Signed)
 Decrease phentermine  to 15 mg once daily due to heart rate elevations and advised pt to keep a close eye on blood pressure and heart rate.  Pdmp reviewed.  Continue with working on diet and exercise.

## 2023-12-29 NOTE — Assessment & Plan Note (Signed)
 Continue magnesium  Melatonin ok to continue as well.

## 2023-12-29 NOTE — Assessment & Plan Note (Signed)
 Cont RX vitamin D  then start on otc vitamin D3 2000 I/U once daily

## 2024-01-16 ENCOUNTER — Encounter (INDEPENDENT_AMBULATORY_CARE_PROVIDER_SITE_OTHER): Payer: Self-pay

## 2024-02-02 ENCOUNTER — Other Ambulatory Visit: Payer: Self-pay | Admitting: Family

## 2024-02-02 DIAGNOSIS — E661 Drug-induced obesity: Secondary | ICD-10-CM

## 2024-02-08 ENCOUNTER — Other Ambulatory Visit: Payer: Self-pay | Admitting: Family

## 2024-02-08 DIAGNOSIS — E66811 Obesity, class 1: Secondary | ICD-10-CM

## 2024-02-08 MED ORDER — PHENTERMINE HCL 15 MG PO CAPS: 15.0000 mg | ORAL_CAPSULE | ORAL | 0 refills | Status: AC

## 2024-11-19 ENCOUNTER — Encounter: Payer: Self-pay | Admitting: Family
